# Patient Record
Sex: Female | Born: 1980 | Race: White | Hispanic: No | Marital: Married | Smoking: Never smoker
Health system: Southern US, Community
[De-identification: ages and names within clinical notes are randomized; demographics above are authoritative.]

## PROBLEM LIST (undated history)

## (undated) DIAGNOSIS — F909 Attention-deficit hyperactivity disorder, unspecified type: Secondary | ICD-10-CM

## (undated) DIAGNOSIS — F419 Anxiety disorder, unspecified: Secondary | ICD-10-CM

## (undated) DIAGNOSIS — G43909 Migraine, unspecified, not intractable, without status migrainosus: Secondary | ICD-10-CM

## (undated) DIAGNOSIS — T7840XA Allergy, unspecified, initial encounter: Secondary | ICD-10-CM

## (undated) DIAGNOSIS — K219 Gastro-esophageal reflux disease without esophagitis: Secondary | ICD-10-CM

## (undated) HISTORY — DX: Anxiety disorder, unspecified: F41.9

## (undated) HISTORY — DX: Gastro-esophageal reflux disease without esophagitis: K21.9

## (undated) HISTORY — DX: Migraine, unspecified, not intractable, without status migrainosus: G43.909

## (undated) HISTORY — DX: Allergy, unspecified, initial encounter: T78.40XA

---
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
## 2004-05-16 ENCOUNTER — Ambulatory Visit: Payer: Self-pay | Admitting: Family Medicine

## 2004-10-20 ENCOUNTER — Ambulatory Visit: Payer: Self-pay | Admitting: Family Medicine

## 2005-02-13 ENCOUNTER — Ambulatory Visit: Payer: Self-pay | Admitting: Family Medicine

## 2005-02-16 ENCOUNTER — Ambulatory Visit: Payer: Self-pay | Admitting: Family Medicine

## 2005-02-21 ENCOUNTER — Other Ambulatory Visit: Admission: RE | Admit: 2005-02-21 | Discharge: 2005-02-21 | Payer: Self-pay | Admitting: Obstetrics and Gynecology

## 2005-03-02 ENCOUNTER — Ambulatory Visit: Payer: Self-pay | Admitting: Family Medicine

## 2005-06-21 ENCOUNTER — Ambulatory Visit: Payer: Self-pay | Admitting: Family Medicine

## 2005-07-17 ENCOUNTER — Ambulatory Visit: Payer: Self-pay | Admitting: Family Medicine

## 2005-10-01 ENCOUNTER — Ambulatory Visit: Payer: Self-pay | Admitting: Family Medicine

## 2006-03-05 ENCOUNTER — Ambulatory Visit: Payer: Self-pay | Admitting: Family Medicine

## 2006-05-08 ENCOUNTER — Ambulatory Visit: Payer: Self-pay | Admitting: Family Medicine

## 2006-11-21 ENCOUNTER — Ambulatory Visit: Payer: Self-pay | Admitting: Family Medicine

## 2006-11-21 DIAGNOSIS — E669 Obesity, unspecified: Secondary | ICD-10-CM | POA: Insufficient documentation

## 2006-11-21 DIAGNOSIS — R635 Abnormal weight gain: Secondary | ICD-10-CM

## 2006-11-21 DIAGNOSIS — R5383 Other fatigue: Secondary | ICD-10-CM

## 2006-11-21 DIAGNOSIS — R5381 Other malaise: Secondary | ICD-10-CM | POA: Insufficient documentation

## 2006-11-21 DIAGNOSIS — E663 Overweight: Secondary | ICD-10-CM | POA: Insufficient documentation

## 2006-11-22 LAB — CONVERTED CEMR LAB
CO2: 25 meq/L (ref 19–32)
Calcium: 9.2 mg/dL (ref 8.4–10.5)
Chloride: 106 meq/L (ref 96–112)
Eosinophils Relative: 2.7 % (ref 0.0–5.0)
Glucose, Bld: 80 mg/dL (ref 70–99)
HCT: 35.5 % — ABNORMAL LOW (ref 36.0–46.0)
Hemoglobin: 12.5 g/dL (ref 12.0–15.0)
MCHC: 35.1 g/dL (ref 30.0–36.0)
MCV: 90.3 fL (ref 78.0–100.0)
Monocytes Absolute: 0.4 10*3/uL (ref 0.2–0.7)
Neutrophils Relative %: 55.3 % (ref 43.0–77.0)
RBC: 3.93 M/uL (ref 3.87–5.11)
RDW: 11.9 % (ref 11.5–14.6)
Sodium: 138 meq/L (ref 135–145)
WBC: 6.2 10*3/uL (ref 4.5–10.5)

## 2006-12-09 ENCOUNTER — Telehealth (INDEPENDENT_AMBULATORY_CARE_PROVIDER_SITE_OTHER): Payer: Self-pay | Admitting: *Deleted

## 2007-01-01 ENCOUNTER — Ambulatory Visit: Payer: Self-pay | Admitting: Family Medicine

## 2007-04-28 ENCOUNTER — Telehealth: Payer: Self-pay | Admitting: Family Medicine

## 2007-05-28 ENCOUNTER — Ambulatory Visit: Payer: Self-pay | Admitting: Family Medicine

## 2007-09-24 ENCOUNTER — Ambulatory Visit: Payer: Self-pay | Admitting: Internal Medicine

## 2007-10-09 ENCOUNTER — Ambulatory Visit: Payer: Self-pay | Admitting: Family Medicine

## 2007-10-09 ENCOUNTER — Telehealth (INDEPENDENT_AMBULATORY_CARE_PROVIDER_SITE_OTHER): Payer: Self-pay | Admitting: Internal Medicine

## 2007-10-21 ENCOUNTER — Ambulatory Visit: Payer: Self-pay | Admitting: Family Medicine

## 2007-12-04 ENCOUNTER — Ambulatory Visit: Payer: Self-pay | Admitting: Family Medicine

## 2008-01-21 ENCOUNTER — Ambulatory Visit: Payer: Self-pay | Admitting: Family Medicine

## 2008-02-02 ENCOUNTER — Ambulatory Visit: Payer: Self-pay | Admitting: Family Medicine

## 2008-02-11 ENCOUNTER — Encounter: Payer: Self-pay | Admitting: Family Medicine

## 2008-05-31 ENCOUNTER — Ambulatory Visit: Payer: Self-pay | Admitting: Family Medicine

## 2008-06-02 ENCOUNTER — Telehealth (INDEPENDENT_AMBULATORY_CARE_PROVIDER_SITE_OTHER): Payer: Self-pay | Admitting: *Deleted

## 2008-07-05 ENCOUNTER — Telehealth: Payer: Self-pay | Admitting: Family Medicine

## 2009-03-25 ENCOUNTER — Telehealth: Payer: Self-pay | Admitting: Family Medicine

## 2009-03-25 ENCOUNTER — Ambulatory Visit: Payer: Self-pay | Admitting: Family Medicine

## 2009-03-25 LAB — CONVERTED CEMR LAB
Bilirubin Urine: NEGATIVE
Glucose, Urine, Semiquant: NEGATIVE
Ketones, urine, test strip: NEGATIVE
Protein, U semiquant: 30
Urobilinogen, UA: 0.2
pH: 6.5

## 2009-09-01 ENCOUNTER — Telehealth: Payer: Self-pay | Admitting: Family Medicine

## 2010-03-08 ENCOUNTER — Ambulatory Visit: Payer: Self-pay | Admitting: Family Medicine

## 2010-03-08 DIAGNOSIS — G43909 Migraine, unspecified, not intractable, without status migrainosus: Secondary | ICD-10-CM

## 2010-04-27 ENCOUNTER — Encounter: Payer: Self-pay | Admitting: Family Medicine

## 2010-05-02 NOTE — Assessment & Plan Note (Signed)
Summary: CONGESTION/DIAHHREA/DLO   Vital Signs:  Patient profile:   30 year old female Height:      66 inches Weight:      153 pounds BMI:     24.78 Temp:     98.4 degrees F oral Pulse rate:   84 / minute Pulse rhythm:   regular BP sitting:   116 / 78  (left arm) Cuff size:   regular  Vitals Entered By: Sydell Axon LPN (March 08, 2010 11:11 AM) CC: Dry cough, sore throat, upset stomach with nausea and diarrhea, could not see out of right eye when she first got up this morning, vision is fine now, has a history of migraines  Vision Screening:Left eye w/o correction: 20 / 20 Right Eye w/o correction: 20 / 20 Both eyes w/o correction:  20/ 20         History of Present Illness: Pt had episode this AM of no vision in her right eye upon awakening which resolved in a minute as usual...has migraines but never told this is part of the aura. She has not had a headache today. No other neurologic difficulties. She also is here for congestion...since yesterday PM. She denies headache, no fever or chills but woke up with ST which continues. No ear pain...has some ringing associated with her migraines. She has significant nasal congestion, which requires manual removal which is painful, the rhinitis is occas red, occas green....for a few weeks. She has minimal cough today which is nonproductive. Has had N since Sun AM with eating and V this AM. She has had diarrhea since Sun, once today. She had eaten at [employer]....tailgate pack. She has taken Pepto which has slowed down the diarrhea.  Problems Prior to Update: 1)  Uti  (ICD-599.0) 2)  Viral Infection, Acute  (ICD-079.99) 3)  Viral Uri  (ICD-465.9) 4)  Fatigue  (ICD-780.79) 5)  Weight Gain, Abnormal  (ICD-783.1)  Medications Prior to Update: 1)  Ocella 3-0.03 Mg Tabs (Drospirenone-Ethinyl Estradiol) .... Take 1 Tablet By Mouth Once A Day 2)  Zyrtec Allergy 10 Mg Tabs (Cetirizine Hcl) .... One By Mouth Daily As Needed 3)   One-A-Day Womens  Tabs (Multiple Vitamins-Calcium) .... One By Mouth Daily 4)  Tropamate 150 Mg. .... Take 1 Tablet By Mouth Once A Day 5)  Skelaxin 800 Mg Tabs (Metaxalone) .... As Needed 6)  Baclofen 10 Mg Tabs (Baclofen) .... As Needed 7)  Cipro 250 Mg Tabs (Ciprofloxacin Hcl) .Marland Kitchen.. 1 By Mouth Two Times A Day For 5 Days  Current Medications (verified): 1)  Ocella 3-0.03 Mg Tabs (Drospirenone-Ethinyl Estradiol) .... Take 1 Tablet By Mouth Once A Day 2)  Zyrtec Allergy 10 Mg Tabs (Cetirizine Hcl) .... One By Mouth Daily As Needed 3)  Skelaxin 800 Mg Tabs (Metaxalone) .... As Needed  Allergies: No Known Drug Allergies  Physical Exam  General:  Well-developed,well-nourished,in no acute distress; alert,appropriate and cooperative throughout examination, congested and hoarse. Head:  Normocephalic, atraumatic, and no abnormalities observed.  Sinuses NT. Eyes:  Conjunctiva minimally inflamed in palpebral distribution. Ears:  External ear exam shows no significant lesions or deformities.  Otoscopic examination reveals clear canals, tympanic membranes are intact bilaterally without bulging, retraction, inflammation or discharge. Hearing is grossly normal bilaterally. Nose:  L nare mildly inflamed with clear thick mucous.  R nare occluded with swelling and thick clear mucous, evidence of scant old blood. Mouth:  pharynx pink and moist.  Mild thick white PND. Neck:  No deformities, masses,  or tenderness noted. Lungs:  Normal respiratory effort, chest expands symmetrically. Lungs are clear to auscultation, no crackles or wheezes. Heart:  Normal rate and regular rhythm. S1 and S2 normal without gallop, murmur, click, rub or other extra sounds.   Impression & Recommendations:  Problem # 1:  GASTROENTERITIS (ICD-558.9) Assessment New  New Acute AGE.  Discussed clears, BRAT and slow advancement. May use Immodium or continued Pepto per label.  Discussed use of medication and role of diet.  Encouraged clear liquids and electrolyte replacement fluids. Instructed to call if any signs of worsening dehydration.   Problem # 2:  OTHER DISEASES OF NASAL CAVITY (ICD-478.19) Assessment: New Discussed clearing of nasal cavity and chronic nose congestion trmts.  Problem # 3:  MIGRAINE, CHRONIC (ICD-346.90) Assessment: Unchanged Will have pt seen by Dr Vela Prose. Orders: Neurology Referral (Neuro)  Problem # 4:  VIRAL INFECTION, ACUTE (ICD-079.99) Assessment: Deteriorated Recurrent. See instructions. Discussed symptomatic relief.   Complete Medication List: 1)  Ocella 3-0.03 Mg Tabs (Drospirenone-ethinyl estradiol) .... Take 1 tablet by mouth once a day 2)  Zyrtec Allergy 10 Mg Tabs (Cetirizine hcl) .... One by mouth daily as needed 3)  Skelaxin 800 Mg Tabs (Metaxalone) .... As needed  Other Orders: Vision Screening (60454)  Patient Instructions: 1)  Refer to Dr Vela Prose. 2)  Take Guaifenesin by going to [facility] or [facility] and getting MUCOUS RELIEF EXPECTORANT (400mg ), take 11/2 tabs by mouth AM and NOON. 3)  Drink lots of fluids anytime taking Guaifenesin.  4)  Tyl ES 2 tabs by mouth three times a day.   Orders Added: 1)  Vision Screening [09811] 2)  Neurology Referral [Neuro] 3)  Est. Patient Level IV [91478]    Current Allergies (reviewed today): No known allergies

## 2010-05-02 NOTE — Progress Notes (Signed)
Summary: sinus symptoms  Phone Note Call from Patient   Caller: Patient Summary of Call: Pt called today complaining of sinus infection sxs- green drainage, sinus pain.  Offered appt for tomorrow, no appts available today, but she said she couldnt come tomorrow, also suggested saturday clinic.  She said she would call back if she decides to go to saturday clinic.  Asks what she can do to to treat her sxs.  Advised plenty of fluids, tylenol or advil for pain, antihistamine might help with drainage.  Again, told her to call back if she changes her mind about appt tomorrow. Initial call taken by: Lowella Petties CMA,  September 01, 2009 3:40 PM  Follow-up for Phone Call        Pt called back, says she needs to be seen today. All the physician were booked, and not able to see anymore pts.  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 Pt says she will handle.Daine Gip  September 01, 2009 3:55 PM   Follow-up by: Daine Gip,  September 01, 2009 3:55 PM  Additional Follow-up for Phone Call Additional follow up Details #1::        in that case I recommend she go to urgent care after hours  Additional Follow-up by: Judith Part MD,  September 01, 2009 4:33 PM

## 2010-05-04 ENCOUNTER — Other Ambulatory Visit: Payer: Self-pay | Admitting: Neurology

## 2010-05-05 ENCOUNTER — Other Ambulatory Visit: Payer: Self-pay | Admitting: Neurology

## 2010-05-10 ENCOUNTER — Other Ambulatory Visit: Payer: Self-pay | Admitting: Neurology

## 2010-05-10 DIAGNOSIS — H547 Unspecified visual loss: Secondary | ICD-10-CM

## 2010-05-10 DIAGNOSIS — R519 Headache, unspecified: Secondary | ICD-10-CM

## 2010-05-13 ENCOUNTER — Ambulatory Visit
Admission: RE | Admit: 2010-05-13 | Discharge: 2010-05-13 | Disposition: A | Discharge status: Home / Self Care | Payer: BC Managed Care – PPO | Source: Ambulatory Visit | Attending: Neurology | Admitting: Neurology

## 2010-05-13 DIAGNOSIS — H547 Unspecified visual loss: Secondary | ICD-10-CM

## 2010-05-13 MED ORDER — GADOBENATE DIMEGLUMINE 529 MG/ML IV SOLN
15.0000 mL | Freq: Once | INTRAVENOUS | Status: AC | PRN
  Administered 2010-05-13: 15 mL via INTRAVENOUS

## 2010-05-30 NOTE — Letter (Signed)
Summary: [facility]  [facility]   Imported By: Kassie Mends 05/15/2010 08:43:12  _____________________________________________________________________  External Attachment:    Type:   Image     Comment:   External Document

## 2010-08-29 ENCOUNTER — Encounter: Payer: Self-pay | Admitting: Family Medicine

## 2010-08-29 ENCOUNTER — Telehealth: Payer: Self-pay | Admitting: *Deleted

## 2010-08-29 ENCOUNTER — Ambulatory Visit (INDEPENDENT_AMBULATORY_CARE_PROVIDER_SITE_OTHER): Payer: BC Managed Care – PPO | Admitting: Family Medicine

## 2010-08-29 VITALS — BP 106/70 | HR 88 | Temp 98.2°F | Ht 66.0 in | Wt 164.8 lb

## 2010-08-29 DIAGNOSIS — N390 Urinary tract infection, site not specified: Secondary | ICD-10-CM | POA: Insufficient documentation

## 2010-08-29 DIAGNOSIS — M549 Dorsalgia, unspecified: Secondary | ICD-10-CM

## 2010-08-29 LAB — POCT URINALYSIS DIPSTICK: Spec Grav, UA: 1.01

## 2010-08-29 MED ORDER — CIPROFLOXACIN HCL 250 MG PO TABS: 250.0000 mg | ORAL_TABLET | Freq: Two times a day (BID) | ORAL | Status: AC

## 2010-08-29 NOTE — Assessment & Plan Note (Signed)
Uncomplicated with frequency and urgency ua obsc by azo, spin / micro pos for rbc and wbc and bacteria  Px cipro 250 bid for 5 d Fluids / watch for fever/ worse symptoms Update if not imp in several days Azo ok for symptoms 1-2d

## 2010-08-29 NOTE — Telephone Encounter (Signed)
Patient says that she is having uti symptoms. She has urgency, but when she goes to the bathroom on has a Karis trickle. She is asking to be seen today, but we have not available appt. Can she be added on. Please advise.

## 2010-08-29 NOTE — Progress Notes (Signed)
  Subjective:    Patient ID: Jacqueline Turner, female    DOB: November 23, 1980, 30 y.o.   MRN: [id number]  HPI Started this am- frequency and urgency  No blood in urine  No burning  Some low back pain right in the middle Is dull/ not too bad  No flank pain  No n/v  No fever  Is drinking water and cranberry juice   No chance pregnant at all  Took azo- so urine is orange/discolored   No past medical history on file.  History   Social History  . Marital Status: Single    Spouse Name: N/A    Number of Children: N/A  . Years of Education: N/A   Occupational History  . Not on file.   Social History Main Topics  . Smoking status: Never Smoker   . Smokeless tobacco: Not on file  . Alcohol Use: Not on file  . Drug Use: Not on file  . Sexually Active: Not on file   Other Topics Concern  . Not on file   Social History Narrative  . No narrative on file       Review of Systems Review of Systems  Constitutional: Negative for fever, appetite change, fatigue and unexpected weight change.  Eyes: Negative for pain and visual disturbance.  Respiratory: Negative for cough and shortness of breath.   Cardiovascular: Negative.   Gastrointestinal: Negative for nausea, diarrhea and constipation.  Genitourinary: pos for frequency and urgency / neg for flank pain Skin: Negative for pallor.  Neurological: Negative for weakness, light-headedness, numbness and headaches.  Hematological: Negative for adenopathy. Does not bruise/bleed easily.  Psychiatric/Behavioral: Negative for dysphoric mood. The patient is not nervous/anxious.          Objective:   Physical Exam  Constitutional: She appears well-developed and well-nourished. No distress.  HENT:  Head: Normocephalic and atraumatic.  Mouth/Throat: Oropharynx is clear and moist.  Eyes: Conjunctivae and EOM are normal. Pupils are equal, round, and reactive to light.  Neck: Normal range of motion. Neck supple. No thyromegaly present.    Cardiovascular: Normal rate, regular rhythm and normal heart sounds.   Abdominal: Bowel sounds are normal. There is tenderness. There is no rebound.       Mild suprapubic tenderness   Musculoskeletal: She exhibits no tenderness.       No cva tenderness No spinal tenderness  Lymphadenopathy:    She has no cervical adenopathy.  Neurological: She is alert. She has normal reflexes.  Skin: Skin is warm and dry. No rash noted.  Psychiatric: She has a normal mood and affect.          Assessment & Plan:

## 2010-08-29 NOTE — Patient Instructions (Signed)
Drink lots of water  azo is ok for a few more days Take the cipro as directed If worse or increased back pain or nausea/ fever - alert me right away Also alert me if not improved after finishing medicine

## 2010-08-29 NOTE — Telephone Encounter (Signed)
Patient has appt scheduled for today at 4:15 w/ Dr. Milinda Antis.

## 2011-06-13 ENCOUNTER — Other Ambulatory Visit: Payer: Self-pay | Admitting: Neurology

## 2011-06-13 DIAGNOSIS — H539 Unspecified visual disturbance: Secondary | ICD-10-CM

## 2011-06-21 ENCOUNTER — Ambulatory Visit
Admission: RE | Admit: 2011-06-21 | Discharge: 2011-06-21 | Disposition: A | Discharge status: Home / Self Care | Payer: BC Managed Care – PPO | Source: Ambulatory Visit | Attending: Neurology | Admitting: Neurology

## 2011-06-21 DIAGNOSIS — H539 Unspecified visual disturbance: Secondary | ICD-10-CM

## 2013-10-30 ENCOUNTER — Ambulatory Visit: Payer: BC Managed Care – PPO | Admitting: Internal Medicine

## 2014-12-27 ENCOUNTER — Ambulatory Visit: Payer: BC Managed Care – PPO | Admitting: Internal Medicine

## 2019-09-02 DIAGNOSIS — G43909 Migraine, unspecified, not intractable, without status migrainosus: Secondary | ICD-10-CM | POA: Insufficient documentation

## 2019-09-02 DIAGNOSIS — J302 Other seasonal allergic rhinitis: Secondary | ICD-10-CM | POA: Insufficient documentation

## 2020-01-13 ENCOUNTER — Other Ambulatory Visit: Payer: Self-pay

## 2020-01-14 ENCOUNTER — Ambulatory Visit: Payer: BC Managed Care – PPO | Admitting: Family Medicine

## 2020-01-14 ENCOUNTER — Encounter: Payer: Self-pay | Admitting: Family Medicine

## 2020-01-14 VITALS — BP 130/76 | HR 85 | Temp 98.4°F | Ht 66.75 in | Wt 225.2 lb

## 2020-01-14 DIAGNOSIS — Z23 Encounter for immunization: Secondary | ICD-10-CM | POA: Diagnosis not present

## 2020-01-14 DIAGNOSIS — E559 Vitamin D deficiency, unspecified: Secondary | ICD-10-CM | POA: Diagnosis not present

## 2020-01-14 DIAGNOSIS — R600 Localized edema: Secondary | ICD-10-CM | POA: Diagnosis not present

## 2020-01-14 DIAGNOSIS — Z0000 Encounter for general adult medical examination without abnormal findings: Secondary | ICD-10-CM | POA: Diagnosis not present

## 2020-01-14 NOTE — Progress Notes (Signed)
Jacqueline Turner is a 39 y.o. female  Chief Complaint  Patient presents with  . Establish Care    NP-CPE/labs.  not fasting today. wantsflu shot today    HPI: Jacqueline Turner is a 39 y.o. female here to establish care with our office and for annual CPE, labs. She is not fasting today and will RTO for fasting lab appt.  She would like flu vaccine today.  She had covid in 04/2019.  She notes occasional heart "flutter" or palpitations. Thought to be stress-related. B/L LE edema x years, happens intermittently, worse as day goes on and improves overnight. No SOB, DOE, orthopnea.   Last PAP: Dr. Rana Snare   Dentist - UTD Vision - wears glasses and UTD on exam  Med refills needed today? none   Past Medical History:  Diagnosis Date  . Allergy   . Anxiety   . Migraines     History reviewed. No pertinent surgical history.  Social History   Socioeconomic History  . Marital status: Single    Spouse name: Not on file  . Number of children: Not on file  . Years of education: Not on file  . Highest education level: Not on file  Occupational History  . Not on file  Tobacco Use  . Smoking status: Never Smoker  . Smokeless tobacco: Never Used  Vaping Use  . Vaping Use: Never used  Substance and Sexual Activity  . Alcohol use: Yes    Comment: socially  . Drug use: Never  . Sexual activity: Yes  Other Topics Concern  . Not on file  Social History Narrative  . Not on file   Social Determinants of Health   Financial Resource Strain:   . Difficulty of Paying Living Expenses: Not on file  Food Insecurity:   . Worried About Programme researcher, broadcasting/film/video in the Last Year: Not on file  . Ran Out of Food in the Last Year: Not on file  Transportation Needs:   . Lack of Transportation (Medical): Not on file  . Lack of Transportation (Non-Medical): Not on file  Physical Activity:   . Days of Exercise per Week: Not on file  . Minutes of Exercise per Session: Not on file  Stress:   .  Feeling of Stress : Not on file  Social Connections:   . Frequency of Communication with Friends and Family: Not on file  . Frequency of Social Gatherings with Friends and Family: Not on file  . Attends Religious Services: Not on file  . Active Member of Clubs or Organizations: Not on file  . Attends Banker Meetings: Not on file  . Marital Status: Not on file  Intimate Partner Violence:   . Fear of Current or Ex-Partner: Not on file  . Emotionally Abused: Not on file  . Physically Abused: Not on file  . Sexually Abused: Not on file    Family History  Problem Relation Age of Onset  . Cancer Mother        breast cancer  . Heart disease Father   . Hypertension Father   . Hyperlipidemia Father      Immunization History  Administered Date(s) Administered  . Influenza,inj,Quad PF,6+ Mos 01/14/2020  . PFIZER SARS-COV-2 Vaccination 06/05/2019, 06/26/2019  . Td 03/05/2006    Outpatient Encounter Medications as of 01/14/2020  Medication Sig  . BLISOVI 24 FE 1-20 MG-MCG(24) tablet Take 1 tablet by mouth daily.  . cetirizine (ZYRTEC) 10 MG tablet Take by  mouth.  . Cholecalciferol (VITAMIN D) 125 MCG (5000 UT) CAPS   . Multiple Vitamins-Minerals (MULTIVITAMIN ADULT EXTRA C PO) multivitamin  . [DISCONTINUED] cetirizine (ZYRTEC) 10 MG tablet Take 10 mg by mouth daily as needed.    . [DISCONTINUED] drospirenone-ethinyl estradiol (YASMIN) 3-0.03 MG per tablet Take 1 tablet by mouth daily.   (Patient not taking: Reported on 01/14/2020)  . [DISCONTINUED] etodolac (LODINE) 500 MG tablet Take 500 mg by mouth 2 (two) times daily as needed.   (Patient not taking: Reported on 01/14/2020)  . [DISCONTINUED] imipramine (TOFRANIL) 10 MG tablet Take 30 mg by mouth at bedtime.   (Patient not taking: Reported on 01/14/2020)  . [DISCONTINUED] metaxalone (SKELAXIN) 800 MG tablet Take by mouth as needed.   (Patient not taking: Reported on 01/14/2020)  . [DISCONTINUED] Multiple Vitamins-Minerals  (MULTIVITAMIN ADULT, MINERALS, PO)  (Patient not taking: Reported on 01/14/2020)  . [DISCONTINUED] norgestrel-ethinyl estradiol (CRYSELLE-28) 0.3-30 MG-MCG per tablet Take 1 tablet by mouth daily.   (Patient not taking: Reported on 01/14/2020)  . [DISCONTINUED] Phenazopyridine HCl (AZO STANDARD MAXIMUM STRENGTH) 97.5 MG TABS Take 2 tablets by mouth 3 (three) times daily after meals.   (Patient not taking: Reported on 01/14/2020)  . [DISCONTINUED] propranolol (INDERAL) 10 MG tablet 1 tab twice daily. If not better in 1 wk take 2 tabs twice daily. If no better in a week take 3 tabs twice daily. Pt is presently taking 3 tabs twice a day.  (Patient not taking: Reported on 01/14/2020)   No facility-administered encounter medications on file as of 01/14/2020.     ROS: Gen: no fever, chills  Skin: no rash, itching ENT: no ear pain, ear drainage, nasal congestion, rhinorrhea, sinus pressure, sore throat Eyes: no blurry vision, double vision Resp: no cough, wheeze,SOB CV: no CP, palpitations, + LE edema (see HPI) GI: no heartburn, n/v/d/c, abd pain GU: no dysuria, urgency, frequency, hematuria MSK: no joint pain, myalgias, back pain Neuro: no dizziness, headache, weakness, vertigo Psych: no depression, anxiety, insomnia   Allergies  Allergen Reactions  . Sulfa Antibiotics     Other reaction(s): Unknown    BP 130/76   Pulse 85   Temp 98.4 F (36.9 C) (Temporal)   Ht 5' 6.75" (1.695 m)   Wt 225 lb 3.2 oz (102.2 kg)   SpO2 98%   BMI 35.54 kg/m   Physical Exam Constitutional:      General: She is not in acute distress.    Appearance: She is well-developed.  HENT:     Head: Normocephalic and atraumatic.     Right Ear: Tympanic membrane and ear canal normal.     Left Ear: Tympanic membrane and ear canal normal.     Nose: Nose normal.  Eyes:     Conjunctiva/sclera: Conjunctivae normal.     Pupils: Pupils are equal, round, and reactive to light.  Neck:     Thyroid: No thyromegaly.    Cardiovascular:     Rate and Rhythm: Normal rate and regular rhythm.     Heart sounds: Normal heart sounds. No murmur heard.   Pulmonary:     Effort: Pulmonary effort is normal. No respiratory distress.     Breath sounds: Normal breath sounds. No wheezing or rhonchi.  Abdominal:     General: Bowel sounds are normal. There is no distension.     Palpations: Abdomen is soft. There is no mass.     Tenderness: There is no abdominal tenderness.  Musculoskeletal:     Cervical back:  Neck supple.     Right lower leg: Edema present.     Left lower leg: Edema (trace B/L pedal edema) present.  Lymphadenopathy:     Cervical: No cervical adenopathy.  Skin:    General: Skin is warm and dry.  Neurological:     Mental Status: She is alert and oriented to person, place, and time.     Motor: No abnormal muscle tone.     Coordination: Coordination normal.  Psychiatric:        Behavior: Behavior normal.      A/P:  1. Annual physical exam - discussed importance of regular CV exercise, healthy diet, adequate sleep - UTD on PAP - UTD on dental and vision - UTD on immunizations - AST; Future - ALT; Future - Basic metabolic panel; Future - CBC; Future - Lipid panel; Future - next CPE in 1 year  2. Vitamin D deficiency - taking 5000IU Vit D daily - VITAMIN D 25 Hydroxy (Vit-D Deficiency, Fractures); Future  3. Need for immunization against influenza - Flu Vaccine QUAD 6+ mos PF IM (Fluarix Quad PF)  4. Bilateral lower extremity edema - dependent edema intermittent x years - discussed importance of adequate hydration, limiting sodium, elevating legs when able, consider compression stockings - no need for PRN diuretic at this time  This visit occurred during the SARS-CoV-2 public health emergency.  Safety protocols were in place, including screening questions prior to the visit, additional usage of staff PPE, and extensive cleaning of exam room while observing appropriate contact time as  indicated for disinfecting solutions.

## 2020-01-25 ENCOUNTER — Other Ambulatory Visit: Payer: Self-pay

## 2020-01-25 ENCOUNTER — Other Ambulatory Visit (INDEPENDENT_AMBULATORY_CARE_PROVIDER_SITE_OTHER): Payer: BC Managed Care – PPO

## 2020-01-25 DIAGNOSIS — E559 Vitamin D deficiency, unspecified: Secondary | ICD-10-CM

## 2020-01-25 DIAGNOSIS — Z0000 Encounter for general adult medical examination without abnormal findings: Secondary | ICD-10-CM | POA: Diagnosis not present

## 2020-01-25 LAB — LIPID PANEL
Cholesterol: 183 mg/dL (ref 0–200)
HDL: 55.3 mg/dL (ref >39.00)
LDL Cholesterol: 94 mg/dL (ref 0–99)
NonHDL: 127.64
Total CHOL/HDL Ratio: 3
Triglycerides: 167 mg/dL — ABNORMAL HIGH (ref 0.0–149.0)
VLDL: 33.4 mg/dL (ref 0.0–40.0)

## 2020-01-25 LAB — BASIC METABOLIC PANEL
BUN: 10 mg/dL (ref 6–23)
CO2: 24 mEq/L (ref 19–32)
Calcium: 8.9 mg/dL (ref 8.4–10.5)
Chloride: 106 mEq/L (ref 96–112)
Creatinine, Ser: 0.68 mg/dL (ref 0.40–1.20)
GFR: 110.71 mL/min (ref >60.00)
Glucose, Bld: 80 mg/dL (ref 70–99)
Potassium: 4.7 mEq/L (ref 3.5–5.1)
Sodium: 138 mEq/L (ref 135–145)

## 2020-01-25 LAB — AST: AST: 16 U/L (ref 0–37)

## 2020-01-25 LAB — CBC
HCT: 37.7 % (ref 36.0–46.0)
Hemoglobin: 12.6 g/dL (ref 12.0–15.0)
MCHC: 33.5 g/dL (ref 30.0–36.0)
MCV: 93.1 fl (ref 78.0–100.0)
Platelets: 239 10*3/uL (ref 150.0–400.0)
RBC: 4.05 Mil/uL (ref 3.87–5.11)
RDW: 13.2 % (ref 11.5–15.5)
WBC: 5.4 10*3/uL (ref 4.0–10.5)

## 2020-01-25 LAB — ALT: ALT: 18 U/L (ref 0–35)

## 2020-01-25 LAB — VITAMIN D 25 HYDROXY (VIT D DEFICIENCY, FRACTURES): VITD: 46.78 ng/mL (ref 30.00–100.00)

## 2020-02-04 ENCOUNTER — Encounter: Payer: Self-pay | Admitting: Family Medicine

## 2020-06-06 ENCOUNTER — Encounter: Payer: Self-pay | Admitting: Family Medicine

## 2020-06-06 ENCOUNTER — Telehealth (INDEPENDENT_AMBULATORY_CARE_PROVIDER_SITE_OTHER): Payer: BC Managed Care – PPO | Admitting: Family Medicine

## 2020-06-06 VITALS — Temp 99.3°F | Ht 66.75 in | Wt 225.0 lb

## 2020-06-06 DIAGNOSIS — J069 Acute upper respiratory infection, unspecified: Secondary | ICD-10-CM

## 2020-06-06 NOTE — Progress Notes (Signed)
[facility] [address] [place] [address] Dept: [phone] Dept Fax: [phone]  Virtual Video Visit  I connected with Soledad Gerlach Killman on 06/06/20 at  4:00 PM EST by a video enabled telemedicine application and verified that I am speaking with the correct person using two identifiers.  Location patient: Home Location provider: Clinic Persons participating in the virtual visit: Patient, Provider  I discussed the limitations of evaluation and management by telemedicine and the availability of in person appointments. The patient expressed understanding and agreed to proceed.  Chief Complaint  Patient presents with  . Acute Visit    Patient c/o having sinus pressure, congestion, runny nose, sneezing, pressure in ears x 2 days.  Took covid test was negative.  Has taken Tylenol and mucinex with Bertholf relief.     SUBJECTIVE:  HPI: Jacqueline Turner is a 40 y.o. female who presents with a 2-day hsitory of sinus pressure, sneezing, nasal congestion, and ear pressure. She denies any cough, fever, dyspnea, N/V/D. Her husband had been sick last week with similar symptoms. Both have had COVID tests which were negative. Ms. Dilworth has been using Dayquil, but was worried it might make her loopy today while working from home. She switched to Mucinex and is using Tylenol. She does have a history of seasonal allergies. She uses either Xyzal or Zyrtec for allergy issues. She does not use tobacco and is not exposed to 2nd hand smoke at home.  Patient Active Problem List   Diagnosis Date Noted  . Migraine 09/02/2019  . Seasonal allergies 09/02/2019  . UTI (lower urinary tract infection) 08/29/2010  . FATIGUE 11/21/2006  . WEIGHT GAIN, ABNORMAL 11/21/2006   No past surgical history on file.  Family History  Problem Relation Age of Onset  . Cancer Mother        breast cancer  . Heart disease Father   . Hypertension Father   .  Hyperlipidemia Father    Social History   Tobacco Use  . Smoking status: Never Smoker  . Smokeless tobacco: Never Used  Vaping Use  . Vaping Use: Never used  Substance Use Topics  . Alcohol use: Yes    Comment: socially  . Drug use: Never    Current Outpatient Medications:  .  BLISOVI 24 FE 1-20 MG-MCG(24) tablet, Take 1 tablet by mouth daily., Disp: , Rfl:  .  cetirizine (ZYRTEC) 10 MG tablet, Take by mouth., Disp: , Rfl:  .  Cholecalciferol (VITAMIN D) 125 MCG (5000 UT) CAPS, , Disp: , Rfl:  .  Multiple Vitamins-Minerals (MULTIVITAMIN ADULT EXTRA C PO), multivitamin, Disp: , Rfl:   Allergies  Allergen Reactions  . Sulfa Antibiotics     Other reaction(s): Unknown   ROS: See pertinent positives and negatives per HPI.  OBSERVATIONS/OBJECTIVE:  VITALS per patient if applicable: Today's Vitals   06/06/20 1555  Temp: 99.3 F (37.4 C)  Weight: 225 lb (102.1 kg)  Height: 5' 6.75" (1.695 m)   Body mass index is 35.5 kg/m.   GENERAL: alert, oriented, appears well and in no acute distress  HEENT: atraumatic, conjunctiva clear, no obvious abnormalities on inspection of external nose and ears. Patients speech consistent with nasal congestion.  NECK: normal movements of the head and neck  LUNGS: on inspection no signs of respiratory distress, breathing rate appears normal, no obvious gross SOB, gasping or wheezing, no conversational dyspnea  CV: no obvious cyanosis  PSYCH/NEURO: pleasant and cooperative, no obvious depression or  anxiety, speech and thought processing grossly intact  ASSESSMENT AND PLAN:  1. Viral upper respiratory illness Ms. Diana appears to have a viral URI. We discussed home care for viral illness. I suggested she might add Sudafed to her Mucinex and Tylenol. She was concerned because of past history of sinus infections. We discussed judicious use of antibiotics. If she develops worsening sinus symptoms and her issue is not resolved by next Wed., she  is to reach out to me to consider antibiotics at that point.   I discussed the assessment and treatment plan with the patient. The patient was provided an opportunity to ask questions and all were answered. The patient agreed with the plan and demonstrated an understanding of the instructions.   The patient was advised to call back or seek an in-person evaluation if the symptoms worsen or if the condition fails to improve as anticipated.   Loyola Mast, MD

## 2020-11-18 ENCOUNTER — Emergency Department: Payer: BC Managed Care – PPO

## 2020-11-18 ENCOUNTER — Other Ambulatory Visit: Payer: Self-pay

## 2020-11-18 DIAGNOSIS — K219 Gastro-esophageal reflux disease without esophagitis: Secondary | ICD-10-CM | POA: Diagnosis not present

## 2020-11-18 DIAGNOSIS — R1010 Upper abdominal pain, unspecified: Secondary | ICD-10-CM | POA: Diagnosis present

## 2020-11-18 LAB — CBC
HCT: 37.6 % (ref 36.0–46.0)
Hemoglobin: 13.2 g/dL (ref 12.0–15.0)
MCH: 31.9 pg (ref 26.0–34.0)
MCHC: 35.1 g/dL (ref 30.0–36.0)
MCV: 90.8 fL (ref 80.0–100.0)
Platelets: 278 K/uL (ref 150–400)
RBC: 4.14 MIL/uL (ref 3.87–5.11)
RDW: 12.7 % (ref 11.5–15.5)
WBC: 8.9 K/uL (ref 4.0–10.5)
nRBC: 0 % (ref 0.0–0.2)

## 2020-11-18 LAB — BASIC METABOLIC PANEL
Anion gap: 7 (ref 5–15)
BUN: 16 mg/dL (ref 6–20)
CO2: 24 mmol/L (ref 22–32)
Calcium: 9 mg/dL (ref 8.9–10.3)
Chloride: 105 mmol/L (ref 98–111)
Creatinine, Ser: 0.79 mg/dL (ref 0.44–1.00)
GFR, Estimated: >60 mL/min (ref >60)
Glucose, Bld: 172 mg/dL — ABNORMAL HIGH (ref 70–99)
Potassium: 4.2 mmol/L (ref 3.5–5.1)
Sodium: 136 mmol/L (ref 135–145)

## 2020-11-18 LAB — LIPASE, BLOOD: Lipase: 38 U/L (ref 11–51)

## 2020-11-18 LAB — HEPATIC FUNCTION PANEL
ALT: 34 U/L (ref 0–44)
AST: 60 U/L — ABNORMAL HIGH (ref 15–41)
Albumin: 3.9 g/dL (ref 3.5–5.0)
Alkaline Phosphatase: 47 U/L (ref 38–126)
Bilirubin, Direct: 0.2 mg/dL (ref 0.0–0.2)
Indirect Bilirubin: 0.5 mg/dL (ref 0.3–0.9)
Total Bilirubin: 0.7 mg/dL (ref 0.3–1.2)
Total Protein: 7.4 g/dL (ref 6.5–8.1)

## 2020-11-18 LAB — TROPONIN I (HIGH SENSITIVITY): Troponin I (High Sensitivity): <2 ng/L (ref <18)

## 2020-11-18 NOTE — ED Triage Notes (Signed)
Epigastric burning and sweatiness after eating, hx gerd

## 2020-11-19 ENCOUNTER — Emergency Department
Admission: EM | Admit: 2020-11-19 | Discharge: 2020-11-19 | Disposition: A | Discharge status: Home / Self Care | Payer: BC Managed Care – PPO | Attending: Emergency Medicine | Admitting: Emergency Medicine

## 2020-11-19 DIAGNOSIS — K219 Gastro-esophageal reflux disease without esophagitis: Secondary | ICD-10-CM

## 2020-11-19 DIAGNOSIS — R0789 Other chest pain: Secondary | ICD-10-CM

## 2020-11-19 LAB — TROPONIN I (HIGH SENSITIVITY): Troponin I (High Sensitivity): <2 ng/L (ref <18)

## 2020-11-19 MED ORDER — PANTOPRAZOLE SODIUM 40 MG PO TBEC
40.0000 mg | DELAYED_RELEASE_TABLET | Freq: Once | ORAL | Status: AC
  Administered 2020-11-19: 40 mg via ORAL
  Filled 2020-11-19: qty 1

## 2020-11-19 MED ORDER — ALUM & MAG HYDROXIDE-SIMETH 200-200-20 MG/5ML PO SUSP
30.0000 mL | Freq: Once | ORAL | Status: AC
  Administered 2020-11-19: 30 mL via ORAL
  Filled 2020-11-19: qty 30

## 2020-11-19 MED ORDER — PANTOPRAZOLE SODIUM 40 MG PO TBEC: 40.0000 mg | DELAYED_RELEASE_TABLET | Freq: Every day | ORAL | 1 refills | Status: DC

## 2020-11-19 MED ORDER — LIDOCAINE VISCOUS HCL 2 % MT SOLN
15.0000 mL | Freq: Once | OROMUCOSAL | Status: AC
  Administered 2020-11-19: 15 mL via ORAL
  Filled 2020-11-19: qty 15

## 2020-11-19 MED ORDER — ONDANSETRON 4 MG PO TBDP
4.0000 mg | ORAL_TABLET | Freq: Once | ORAL | Status: AC
  Administered 2020-11-19: 4 mg via ORAL
  Filled 2020-11-19: qty 1

## 2020-11-19 NOTE — Discharge Instructions (Signed)
Please avoid NSAIDs such as aspirin (Goody powders), ibuprofen (Motrin, Advil), naproxen (Aleve) as these may worsen your symptoms.  Tylenol 1000 mg every 6 hours is safe to take as long as you have no history of liver problems (heavy alcohol use, cirrhosis, hepatitis).  Please avoid spicy, acidic (citrus fruits, tomato based sauces, salsa), greasy, fatty foods.  Please avoid caffeine and alcohol.  Smoking can also make GERD/acid reflux worse.  Over the counter medications such as TUMS, Maalox or Mylanta, pepcid, Prilosec or Nexium may help with your symptoms.  Do not take Prilosec or Nexium if you are already prescribed a proton pump inhibitor.  

## 2020-11-19 NOTE — ED Notes (Signed)
Pt NAD, a/ox4. Pt verbalizes understanding of all DC and f/u instructions. All questions answered. Pt walks with steady gait to lobby 

## 2020-11-19 NOTE — ED Provider Notes (Signed)
[facility] Emergency Department Provider Note  ____________________________________________   Event Date/Time   First MD Initiated Contact with Patient 11/19/20 0013     (approximate)  I have reviewed the triage vital signs and the nursing notes.   HISTORY  Chief Complaint Chest Pain    HPI Jacqueline Turner is a 40 y.o. female with history of acid reflux, migraines, anxiety, allergies who presents to the emergency department with complaints of upper abdominal pain underneath her sternum that she describes as sharp.  She had diaphoresis with this episode and nausea.  No chest pain or shortness of breath.  States she feels much better now.  She did not take any medications prior to arrival.  She became concerned because her father had an MI in his 61s.  She denies any history of hypertension, diabetes, hyperlipidemia, tobacco use.  She is on birth control but denies history of PE, DVT.  No recent fractures, surgery, trauma, hospitalization, travel, immobilization.  No calf tenderness or calf swelling.       Past Medical History:  Diagnosis Date   Allergy    Anxiety    Migraines     Patient Active Problem List   Diagnosis Date Noted   Migraine 09/02/2019   Seasonal allergies 09/02/2019   UTI (lower urinary tract infection) 08/29/2010   FATIGUE 11/21/2006   WEIGHT GAIN, ABNORMAL 11/21/2006    No past surgical history on file.  Prior to Admission medications   Medication Sig Start Date End Date Taking? Authorizing Provider  liraglutide (VICTOZA) 18 MG/3ML SOPN Inject into the skin.   Yes [provider]  Lisdexamfetamine Dimesylate (VYVANSE PO) Take by mouth.   Yes [provider]  pantoprazole (PROTONIX) 40 MG tablet Take 1 tablet (40 mg total) by mouth daily. 11/19/20 11/19/21 Yes Brixton Schnapp N, DO  BLISOVI 24 FE 1-20 MG-MCG(24) tablet Take 1 tablet by mouth daily. 12/26/19   [provider]  cetirizine (ZYRTEC) 10 MG  tablet Take by mouth.    [provider]  Cholecalciferol (VITAMIN D) 125 MCG (5000 UT) CAPS     [provider]  Multiple Vitamins-Minerals (MULTIVITAMIN ADULT EXTRA C PO) multivitamin    [provider]    Allergies Sulfa antibiotics  Family History  Problem Relation Age of Onset   Cancer Mother        breast cancer   Heart disease Father    Hypertension Father    Hyperlipidemia Father     Social History Social History   Tobacco Use   Smoking status: Never   Smokeless tobacco: Never  Vaping Use   Vaping Use: Never used  Substance Use Topics   Alcohol use: Yes    Comment: socially   Drug use: Never    Review of Systems Constitutional: No fever. Eyes: No visual changes. ENT: No sore throat. Cardiovascular: Denies chest pain. Respiratory: Denies shortness of breath. Gastrointestinal: No vomiting, diarrhea. Genitourinary: Negative for dysuria. Musculoskeletal: Negative for back pain. Skin: Negative for rash. Neurological: Negative for focal weakness or numbness.  ____________________________________________   PHYSICAL EXAM:  VITAL SIGNS: ED Triage Vitals  Enc Vitals Group     BP 11/18/20 2101 138/89     Pulse Rate 11/18/20 2101 89     Resp 11/18/20 2101 16     Temp 11/18/20 2101 97.7 F (36.5 C)     Temp Source 11/18/20 2101 Oral     SpO2 11/18/20 2101 99 %     Weight  11/18/20 2056 205 lb (93 kg)     Height 11/18/20 2056 5\' 6"  (1.676 m)     Head Circumference --      Peak Flow --      Pain Score 11/18/20 2056 7     Pain Loc --      Pain Edu? --      Excl. in GC? --    CONSTITUTIONAL: Alert and oriented and responds appropriately to questions. Well-appearing; well-nourished HEAD: Normocephalic EYES: Conjunctivae clear, pupils appear equal, EOM appear intact ENT: normal nose; moist mucous membranes NECK: Supple, normal ROM CARD: RRR; S1 and S2 appreciated; no murmurs, no clicks, no rubs, no gallops RESP: Normal chest  excursion without splinting or tachypnea; breath sounds clear and equal bilaterally; no wheezes, no rhonchi, no rales, no hypoxia or respiratory distress, speaking full sentences ABD/GI: Normal bowel sounds; non-distended; soft, non-tender, no rebound, no guarding, no peritoneal signs, no hepatosplenomegaly, negative Murphy sign, no tenderness in the right upper quadrant, no tenderness at McBurney's point BACK: The back appears normal EXT: Normal ROM in all joints; no deformity noted, no edema; no cyanosis SKIN: Normal color for age and race; warm; no rash on exposed skin NEURO: Moves all extremities equally PSYCH: The patient's mood and manner are appropriate.  ____________________________________________   LABS (all labs ordered are listed, but only abnormal results are displayed)  Labs Reviewed  BASIC METABOLIC PANEL - Abnormal; Notable for the following components:      Result Value   Glucose, Bld 172 (*)    All other components within normal limits  HEPATIC FUNCTION PANEL - Abnormal; Notable for the following components:   AST 60 (*)    All other components within normal limits  CBC  LIPASE, BLOOD  TROPONIN I (HIGH SENSITIVITY)  TROPONIN I (HIGH SENSITIVITY)   ____________________________________________  EKG   EKG Interpretation  Date/Time:  Friday November 18 2020 20:58:41 EDT Ventricular Rate:  88 PR Interval:  134 QRS Duration: 90 QT Interval:  382 QTC Calculation: 462 R Axis:   87 Text Interpretation: Normal sinus rhythm with sinus arrhythmia Normal ECG Confirmed by 01-16-1969 [phone]) on 11/19/2020 8:02:59 AM        ____________________________________________  RADIOLOGY 11/21/2020 Yuko Coventry, personally viewed and evaluated these images (plain radiographs) as part of my medical decision making, as well as reviewing the written report by the radiologist.  ED MD interpretation: Chest x-ray clear  Official radiology report(s): DG Chest 2 View  Result Date:  11/18/2020 CLINICAL DATA:  Chest pain, epigastric abdominal pain, diaphoresis EXAM: CHEST - 2 VIEW COMPARISON:  None. FINDINGS: The heart size and mediastinal contours are within normal limits. Both lungs are clear. The visualized skeletal structures are unremarkable. IMPRESSION: No active cardiopulmonary disease. Electronically Signed   By: 11/20/2020 M.D.   On: 11/18/2020 21:18    ____________________________________________   PROCEDURES  Procedure(s) performed (including Critical Care):  Procedures   ____________________________________________   INITIAL IMPRESSION / ASSESSMENT AND PLAN / ED COURSE  As part of my medical decision making, I reviewed the following data within the electronic MEDICAL RECORD NUMBER Nursing notes reviewed and incorporated, Labs reviewed , EKG interpreted , Old chart reviewed, Radiograph reviewed , and Notes from prior ED visits         Patient here with epigastric pain, nausea and diaphoresis that started after eating a prime rib sandwich.  Symptoms have improved.  She now thinks that this was acid reflux that she has a history of  the same.  Cardiac work-up has been reassuring.  Normal EKG, troponin.  Clear chest x-ray.  Doubt ACS, PE, dissection.  Abdominal exam right now is benign.  She has no tenderness at McBurney's point and has a negative Murphy sign.  She has no known history of cholelithiasis.  Low suspicion currently for cholecystitis, pancreatitis.  LFTs unremarkable other than very minimal elevation of her AST.  Lipase is normal.  Patient given GI cocktail and Protonix and she reports feeling much better.  Will discharge home with prescription of Protonix.  Discussed supportive care instructions and return precautions.  She is comfortable with this plan.   At this time, I do not feel there is any life-threatening condition present. I have reviewed, interpreted and discussed all results (EKG, imaging, lab, urine as appropriate) and exam findings with  patient/family. I have reviewed nursing notes and appropriate previous records.  I feel the patient is safe to be discharged home without further emergent workup and can continue workup as an outpatient as needed. Discussed usual and customary return precautions. Patient/family verbalize understanding and are comfortable with this plan.  Outpatient follow-up has been provided as needed. All questions have been answered.  ____________________________________________   FINAL CLINICAL IMPRESSION(S) / ED DIAGNOSES  Final diagnoses:  Atypical chest pain  Gastroesophageal reflux disease, unspecified whether esophagitis present     ED Discharge Orders          Ordered    pantoprazole (PROTONIX) 40 MG tablet  Daily        11/19/20 0137            *Please note:  Jacqueline Turner was evaluated in Emergency Department on 11/19/2020 for the symptoms described in the history of present illness. She was evaluated in the context of the global COVID-19 pandemic, which necessitated consideration that the patient might be at risk for infection with the SARS-CoV-2 virus that causes COVID-19. Institutional protocols and algorithms that pertain to the evaluation of patients at risk for COVID-19 are in a state of rapid change based on information released by regulatory bodies including the CDC and federal and state organizations. These policies and algorithms were followed during the patient's care in the ED.  Some ED evaluations and interventions may be delayed as a result of limited staffing during and the pandemic.*   Note:  This document was prepared using Dragon voice recognition software and may include unintentional dictation errors.    Garnetta Fedrick, Layla Maw, DO 11/19/20 [phone]

## 2020-11-29 ENCOUNTER — Other Ambulatory Visit: Payer: Self-pay

## 2020-11-29 ENCOUNTER — Ambulatory Visit: Payer: BC Managed Care – PPO | Admitting: Family Medicine

## 2020-11-29 ENCOUNTER — Encounter: Payer: Self-pay | Admitting: Family Medicine

## 2020-11-29 VITALS — BP 130/90 | HR 77 | Temp 97.7°F | Ht 66.0 in | Wt 206.8 lb

## 2020-11-29 DIAGNOSIS — K219 Gastro-esophageal reflux disease without esophagitis: Secondary | ICD-10-CM | POA: Diagnosis not present

## 2020-11-29 DIAGNOSIS — R739 Hyperglycemia, unspecified: Secondary | ICD-10-CM | POA: Diagnosis not present

## 2020-11-29 NOTE — Patient Instructions (Signed)
[employer]'s Current Therapy 2021 (pp. 213-216). [place], [place]: Elsevier.">  Gastroesophageal Reflux Disease, Adult Gastroesophageal reflux (GER) happens when acid from the stomach flows up into the tube that connects the mouth and the stomach (esophagus). Normally, food travels down the esophagus and stays in the stomach to be digested. However, when a person has GER, food and stomach acid sometimes move back up into the esophagus. If this becomes a more serious problem, the person may be diagnosed with a disease called gastroesophageal reflux disease (GERD). GERD occurs when the reflux: Happens often. Causes frequent or severe symptoms. Causes problems such as damage to the esophagus. When stomach acid comes in contact with the esophagus, the acid may cause inflammation in the esophagus. Over time, GERD may create small holes (ulcers) in the lining of the esophagus. What are the causes? This condition is caused by a problem with the muscle between the esophagus and the stomach (lower esophageal sphincter, or LES). Normally, the LES muscle closes after food passes through the esophagus to the stomach. When the LES is weakened or abnormal, it does not close properly, and that allows food and stomach acid to go back up into theesophagus. The LES can be weakened by certain dietary substances, medicines, and medical conditions, including: Tobacco use. Pregnancy. Having a hiatal hernia. Alcohol use. Certain foods and beverages, such as coffee, chocolate, onions, and peppermint. What increases the risk? You are more likely to develop this condition if you: Have an increased body weight. Have a connective tissue disorder. Take NSAIDs, such as ibuprofen. What are the signs or symptoms? Symptoms of this condition include: Heartburn. Difficult or painful swallowing and the feeling of having a lump in the throat. A bitter taste in the mouth. Bad breath and having a large amount of saliva. Having an  upset or bloated stomach and belching. Chest pain. Different conditions can cause chest pain. Make sure you see your health care provider if you experience chest pain. Shortness of breath or wheezing. Ongoing (chronic) cough or a nighttime cough. Wearing away of tooth enamel. Weight loss. How is this diagnosed? This condition may be diagnosed based on a medical history and a physical exam. To determine if you have mild or severe GERD, your health care provider may also monitor how you respond to treatment. You may also have tests, including: A test to examine your stomach and esophagus with a small camera (endoscopy). A test that measures the acidity level in your esophagus. A test that measures how much pressure is on your esophagus. A barium swallow or modified barium swallow test to show the shape, size, and functioning of your esophagus. How is this treated? Treatment for this condition may vary depending on how severe your symptoms are. Your health care provider may recommend: Changes to your diet. Medicine. Surgery. The goal of treatment is to help relieve your symptoms and to preventcomplications. Follow these instructions at home: Eating and drinking  Follow a diet as recommended by your health care provider. This may involve avoiding foods and drinks such as: Coffee and tea, with or without caffeine. Drinks that contain alcohol. Energy drinks and sports drinks. Carbonated drinks or sodas. Chocolate and cocoa. Peppermint and mint flavorings. Garlic and onions. Horseradish. Spicy and acidic foods, including peppers, chili powder, curry powder, vinegar, hot sauces, and barbecue sauce. Citrus fruit juices and citrus fruits, such as oranges, lemons, and limes. Tomato-based foods, such as red sauce, chili, salsa, and pizza with red sauce. Fried and fatty foods, such as   donuts, french fries, potato chips, and high-fat dressings. High-fat meats, such as hot dogs and fatty cuts of  red and white meats, such as rib eye steak, sausage, ham, and bacon. High-fat dairy items, such as whole milk, butter, and cream cheese. Eat small, frequent meals instead of large meals. Avoid drinking large amounts of liquid with your meals. Avoid eating meals during the 2-3 hours before bedtime. Avoid lying down right after you eat. Do not exercise right after you eat.  Lifestyle  Do not use any products that contain nicotine or tobacco. These products include cigarettes, chewing tobacco, and vaping devices, such as e-cigarettes. If you need help quitting, ask your health care provider. Try to reduce your stress by using methods such as yoga or meditation. If you need help reducing stress, ask your health care provider. If you are overweight, reduce your weight to an amount that is healthy for you. Ask your health care provider for guidance about a safe weight loss goal.  General instructions Pay attention to any changes in your symptoms. Take over-the-counter and prescription medicines only as told by your health care provider. Do not take aspirin, ibuprofen, or other NSAIDs unless your health care provider told you to take these medicines. Wear loose-fitting clothing. Do not wear anything tight around your waist that causes pressure on your abdomen. Raise (elevate) the head of your bed about 6 inches (15 cm). You can use a wedge to do this. Avoid bending over if this makes your symptoms worse. Keep all follow-up visits. This is important. Contact a health care provider if: You have: New symptoms. Unexplained weight loss. Difficulty swallowing or it hurts to swallow. Wheezing or a persistent cough. A hoarse voice. Your symptoms do not improve with treatment. Get help right away if: You have sudden pain in your arms, neck, jaw, teeth, or back. You suddenly feel sweaty, dizzy, or light-headed. You have chest pain or shortness of breath. You vomit and the vomit is green, yellow, or  black, or it looks like blood or coffee grounds. You faint. You have stool that is red, bloody, or black. You cannot swallow, drink, or eat. These symptoms may represent a serious problem that is an emergency. Do not wait to see if the symptoms will go away. Get medical help right away. Call your local emergency services (911 in the [place] Do not drive yourself to the hospital. Summary Gastroesophageal reflux happens when acid from the stomach flows up into the esophagus. GERD is a disease in which the reflux happens often, causes frequent or severe symptoms, or causes problems such as damage to the esophagus. Treatment for this condition may vary depending on how severe your symptoms are. Your health care provider may recommend diet and lifestyle changes, medicine, or surgery. Contact a health care provider if you have new or worsening symptoms. Take over-the-counter and prescription medicines only as told by your health care provider. Do not take aspirin, ibuprofen, or other NSAIDs unless your health care provider told you to do so. Keep all follow-up visits as told by your health care provider. This is important. This information is not intended to replace advice given to you by your health care provider. Make sure you discuss any questions you have with your healthcare provider. Document Revised: 09/28/2019 Document Reviewed: 09/28/2019 Elsevier Patient Education  2022 Elsevier Inc.  

## 2020-11-29 NOTE — Progress Notes (Signed)
[facility] [address] [place] [address] Dept: [phone] Dept Fax: [phone]  Office Visit  Subjective:    Patient ID: Jacqueline Turner, female    DOB: [birth year], 40 y.o..   MRN: [id number]  Chief Complaint  Patient presents with   Follow-up    2 wk Hosp f/u     History of Present Illness:  Patient is in today for follow-up from a recent ER visit. She was seen at [facility] on 8/20 with a complaint of atypical chest pain. She had an evaluation that did not demonstrate a cardiac cause, and was felt to have GERD and was started on Protonix. Jacqueline Turner notes that she has had progressive issues with acid reflux that have been worsening with time. She used ot have only periodic issues that were managed with Tums. Over time, she would have more significant bouts that required her to take an occasional Prilosec. More recently, these have been happening several times a week. Her most recent episode was also associated with a pressure sensation in the chest and diaphoresis. She finds her reflux is worsened when eating certain foods, such as red sauces, bread, spicy foods, and alcohol. She is now avoiding these.The ER prescribed Protonix and her symptoms have been improved on this therapy.  Past Medical History: Patient Active Problem List   Diagnosis Date Noted   Gastroesophageal reflux 11/29/2020   Migraine 09/02/2019   Seasonal allergies 09/02/2019   UTI (lower urinary tract infection) 08/29/2010   FATIGUE 11/21/2006   Obesity (BMI 30.0-34.9) 11/21/2006   History reviewed. No pertinent surgical history.  Family History  Problem Relation Age of Onset   Cancer Mother        breast cancer   Heart disease Father    Hypertension Father    Hyperlipidemia Father    Outpatient Medications Prior to Visit  Medication Sig Dispense Refill   cetirizine (ZYRTEC) 10 MG tablet Take by mouth.     Cholecalciferol (VITAMIN D) 125 MCG  (5000 UT) CAPS      liraglutide (VICTOZA) 18 MG/3ML SOPN Inject into the skin.     Lisdexamfetamine Dimesylate (VYVANSE PO) Take by mouth.     Multiple Vitamins-Minerals (MULTIVITAMIN ADULT EXTRA C PO) multivitamin     Norethindrone Acetate-Ethinyl Estrad-FE (HAILEY 24 FE) 1-20 MG-MCG(24) tablet Hailey 24 Fe 1 mg-20 mcg (24)/75 mg (4) tablet  TAKE 1 TABLET BY MOUTH EVERY DAY     pantoprazole (PROTONIX) 40 MG tablet Take 1 tablet (40 mg total) by mouth daily. 30 tablet 1   BLISOVI 24 FE 1-20 MG-MCG(24) tablet Take 1 tablet by mouth daily. (Patient not taking: Reported on 11/29/2020)     No facility-administered medications prior to visit.   Allergies  Allergen Reactions   Sulfa Antibiotics     Other reaction(s): Unknown    Objective:   Today's Vitals   11/29/20 1129  BP: 130/90  Pulse: 77  Temp: 97.7 F (36.5 C)  TempSrc: Temporal  SpO2: 99%  Weight: 206 lb 12.8 oz (93.8 kg)  Height: 5\' 6"  (1.676 m)   Body mass index is 33.38 kg/m.   General: Well developed, well nourished. No acute distress. Psych: Alert and oriented x3. Normal mood and affect.  Health Maintenance Due  Topic Date Due   HIV Screening  Never done   Hepatitis C Screening  Never done   PAP SMEAR-Modifier  Never done   TETANUS/TDAP  03/05/2016   INFLUENZA VACCINE  10/31/2020  Lab Results BMP Latest Ref Rng & Units 11/18/2020 01/25/2020 11/21/2006  Glucose 70 - 99 mg/dL 299(M) 80 80  BUN 6 - 20 mg/dL 16 10 12   Creatinine 0.44 - 1.00 mg/dL 4.26 0.7  Sodium 8.34 - 145 mmol/L 136 138 138  Potassium 3.5 - 5.1 mmol/L 4.2 4.7 4.0  Chloride 98 - 111 mmol/L 105 106 106  CO2 22 - 32 mmol/L 24 24 25   Calcium 8.9 - 10.3 mg/dL 9.0 8.9 9.2     Assessment & Plan:   1. Gastroesophageal reflux disease, unspecified whether esophagitis present We discussed the causes of GERD and its management. At this point, I recommend she complete a 1 month course of Protonix. We then will give a trial off of medication. She  should continue to avoid foods that may exacerbate this. She will follow-up with me for a TOC appointment.  2. Hyperglycemia Jacqueline Turner's blood sugar was elevated in the ER. I will have her return for fasting labs to assess for whether there may be a diabetes issue.  - Hemoglobin A1c; Future - Glucose, random; Future  196, MD

## 2020-11-30 ENCOUNTER — Other Ambulatory Visit (INDEPENDENT_AMBULATORY_CARE_PROVIDER_SITE_OTHER): Payer: BC Managed Care – PPO

## 2020-11-30 DIAGNOSIS — R739 Hyperglycemia, unspecified: Secondary | ICD-10-CM | POA: Diagnosis not present

## 2020-11-30 LAB — HEMOGLOBIN A1C: Hgb A1c MFr Bld: 5 % (ref 4.6–6.5)

## 2020-11-30 LAB — GLUCOSE, RANDOM: Glucose, Bld: 83 mg/dL (ref 70–99)

## 2021-02-09 ENCOUNTER — Encounter: Payer: BC Managed Care – PPO | Admitting: Family Medicine

## 2021-02-10 ENCOUNTER — Ambulatory Visit: Payer: BC Managed Care – PPO | Admitting: Family Medicine

## 2021-02-10 ENCOUNTER — Other Ambulatory Visit: Payer: Self-pay

## 2021-02-10 ENCOUNTER — Encounter: Payer: Self-pay | Admitting: Family Medicine

## 2021-02-10 VITALS — BP 120/90 | HR 84 | Temp 97.3°F | Ht 66.0 in | Wt 195.4 lb

## 2021-02-10 DIAGNOSIS — Z23 Encounter for immunization: Secondary | ICD-10-CM | POA: Diagnosis not present

## 2021-02-10 DIAGNOSIS — K219 Gastro-esophageal reflux disease without esophagitis: Secondary | ICD-10-CM

## 2021-02-10 DIAGNOSIS — R87619 Unspecified abnormal cytological findings in specimens from cervix uteri: Secondary | ICD-10-CM

## 2021-02-10 DIAGNOSIS — E669 Obesity, unspecified: Secondary | ICD-10-CM | POA: Diagnosis not present

## 2021-02-10 DIAGNOSIS — F50819 Binge eating disorder, unspecified: Secondary | ICD-10-CM | POA: Insufficient documentation

## 2021-02-10 DIAGNOSIS — F5081 Binge eating disorder: Secondary | ICD-10-CM | POA: Insufficient documentation

## 2021-02-10 MED ORDER — PANTOPRAZOLE SODIUM 40 MG PO TBEC: 40.0000 mg | DELAYED_RELEASE_TABLET | Freq: Every day | ORAL | 11 refills | Status: DC

## 2021-02-10 NOTE — Progress Notes (Signed)
[facility]IMARY [facility]ARE [facility] [address] [place] [address] Dept: [phone] Dept Fax: [phone]  Transfer of Care Office Visit  Subjective:    Patient ID: Jacqueline Turner, female    DOB: 1980/12/18, 40 y.o..   MRN: [id number]  Chief Complaint  Patient presents with   Establish Care    TOC-Establish care    History of Present Illness:  Patient is in today to establish care. Ms. Eversley was born in [place]. She has a degree in fine arts with a concentration in ceramics from [employer]. 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 She has been married for 5 years. She has no children. She denies tobacco or drug use. She drinks socially.  Ms. Prestwood has a history of GERD. I had seen her in August related to an episode of chest pain that was felt to be due to GERD. She did improve on Protonix. She stopped this and some of her symptoms have returned. However, she prefers to not take the Protonix all the time. She plans to use this as needed through the [facility].  Ms. Cuddeback has a history of obesity. She is followed through a weight loss program at [facility]. She was initially managed on liraglutide. As she had been noted ot have a binge eating issue, she was also tried on Vyvanse. The Vyvanse was stopped after her episode of chest pain. As she was not consistent with the liraglutide injections, she has now been switched to semaglutide (Rybelsus) 14 mg daily. She notes 40 lobs. of weight loss over the past 7 months. Her goal weight is in the 160s and she does want to be healthy.  Ms. Tosh has a history of abnormal pap smears. She is seen by Dr. Rana Snare and is being followed closely related to these.  Past Medical History: Patient Active Problem List   Diagnosis Date Noted   Abnormal Pap smear of cervix 02/10/2021   Binge eating disorder 02/10/2021   Gastroesophageal reflux 11/29/2020   Migraine  09/02/2019   Seasonal allergies 09/02/2019   Obesity (BMI 30.0-34.9) 11/21/2006    History reviewed. No pertinent surgical history.  Family History  Problem Relation Age of Onset   Cancer Mother        breast cancer   Diabetes Father    Heart disease Father    Hypertension Father    Hyperlipidemia Father    Heart disease Paternal Uncle    Heart disease Paternal Uncle    Heart disease Paternal Uncle    Cancer Paternal Uncle        Pancreatic   Cancer Maternal Grandmother        Skin, cervical   Stroke Maternal Grandmother    Dementia Maternal Grandmother    Cancer Paternal Grandfather        Leukemia   Outpatient Medications Prior to Visit  Medication Sig Dispense Refill   cetirizine (ZYRTEC) 10 MG tablet Take by mouth.     Multiple Vitamins-Minerals (MULTIVITAMIN ADULT EXTRA C PO) multivitamin     Norethindrone Acetate-Ethinyl Estrad-FE (HAILEY 24 FE) 1-20 MG-MCG(24) tablet Hailey 24 Fe 1 mg-20 mcg (24)/75 mg (4) tablet  TAKE 1 TABLET BY MOUTH EVERY DAY     RYBELSUS 14 MG TABS Take 1 tablet by mouth daily.     Cholecalciferol (VITAMIN D) 125 MCG (5000 UT) CAPS  (Patient not taking: Reported on 02/10/2021)     liraglutide (VICTOZA) 18  MG/3ML SOPN Inject into the skin. (Patient not taking: Reported on 02/10/2021)     Lisdexamfetamine Dimesylate (VYVANSE PO) Take by mouth. (Patient not taking: Reported on 02/10/2021)     pantoprazole (PROTONIX) 40 MG tablet Take 1 tablet (40 mg total) by mouth daily. (Patient not taking: Reported on 02/10/2021) 30 tablet 1   No facility-administered medications prior to visit.   Allergies  Allergen Reactions   Sulfa Antibiotics     Other reaction(s): Unknown   Objective:   Today's Vitals   02/10/21 1007  BP: 120/90  Pulse: 84  Temp: (!) 97.3 F (36.3 C)  TempSrc: Temporal  SpO2: 98%  Weight: 195 lb 6.4 oz (88.6 kg)  Height: 5\' 6"  (1.676 m)   Body mass index is 31.54 kg/m.   General: Well developed, well nourished. No acute  distress. Psych: Alert and oriented. Normal mood and affect.  Health Maintenance Due  Topic Date Due   HIV Screening  Never done   Hepatitis C Screening  Never done   PAP SMEAR-Modifier  Never done   TETANUS/TDAP  03/05/2016   COVID-19 Vaccine (3 - Booster for Pfizer series) 08/21/2019      Assessment & Plan:   1. Gastroesophageal reflux disease, unspecified whether esophagitis present I recommend that Ms. Corriher consider going back on her PPI for the time being, as it does seem to control her symptoms better.  - pantoprazole (PROTONIX) 40 MG tablet; Take 1 tablet (40 mg total) by mouth daily.  Dispense: 30 tablet; Refill: 11  2. Obesity (BMI 30.0-34.9) Ms. Tabone is having success working with Clarene Duke and using semaglutide.  3. Abnormal cervical Papanicolaou smear, unspecified abnormal pap finding She will continue to follow with her GYN.    [facility], MD

## 2021-08-11 ENCOUNTER — Ambulatory Visit: Payer: BC Managed Care – PPO | Admitting: Family Medicine

## 2021-08-14 ENCOUNTER — Ambulatory Visit: Payer: BC Managed Care – PPO | Admitting: Family Medicine

## 2021-08-16 ENCOUNTER — Ambulatory Visit (INDEPENDENT_AMBULATORY_CARE_PROVIDER_SITE_OTHER): Payer: BC Managed Care – PPO | Admitting: Family Medicine

## 2021-08-16 VITALS — BP 118/74 | HR 74 | Temp 97.8°F | Ht 66.0 in | Wt 171.6 lb

## 2021-08-16 DIAGNOSIS — F5081 Binge eating disorder: Secondary | ICD-10-CM | POA: Diagnosis not present

## 2021-08-16 DIAGNOSIS — K219 Gastro-esophageal reflux disease without esophagitis: Secondary | ICD-10-CM | POA: Diagnosis not present

## 2021-08-16 DIAGNOSIS — E663 Overweight: Secondary | ICD-10-CM

## 2021-08-16 DIAGNOSIS — E781 Pure hyperglyceridemia: Secondary | ICD-10-CM

## 2021-08-16 DIAGNOSIS — R7401 Elevation of levels of liver transaminase levels: Secondary | ICD-10-CM

## 2021-08-16 DIAGNOSIS — Z1159 Encounter for screening for other viral diseases: Secondary | ICD-10-CM

## 2021-08-16 LAB — LIPID PANEL
Cholesterol: 158 mg/dL (ref 0–200)
HDL: 53.3 mg/dL (ref >39.00)
LDL Cholesterol: 86 mg/dL (ref 0–99)
NonHDL: 104.22
Total CHOL/HDL Ratio: 3
Triglycerides: 91 mg/dL (ref 0.0–149.0)
VLDL: 18.2 mg/dL (ref 0.0–40.0)

## 2021-08-16 LAB — COMPREHENSIVE METABOLIC PANEL
ALT: 9 U/L (ref 0–35)
AST: 11 U/L (ref 0–37)
Albumin: 4.1 g/dL (ref 3.5–5.2)
Alkaline Phosphatase: 34 U/L — ABNORMAL LOW (ref 39–117)
BUN: 11 mg/dL (ref 6–23)
CO2: 24 mEq/L (ref 19–32)
Calcium: 9.3 mg/dL (ref 8.4–10.5)
Chloride: 106 mEq/L (ref 96–112)
Creatinine, Ser: 0.76 mg/dL (ref 0.40–1.20)
GFR: 98.53 mL/min (ref >60.00)
Glucose, Bld: 90 mg/dL (ref 70–99)
Potassium: 4.6 mEq/L (ref 3.5–5.1)
Sodium: 138 mEq/L (ref 135–145)
Total Bilirubin: 0.5 mg/dL (ref 0.2–1.2)
Total Protein: 7.2 g/dL (ref 6.0–8.3)

## 2021-08-16 NOTE — Progress Notes (Signed)
?[facility] ?[facility] ?[place] ?[place] [address] ?Dept: [phone] ?Dept Fax: [phone] ? ?Chronic Care Office Visit ? ?Subjective:  ? ? Patient ID: Jacqueline Turner, female    DOB: November 29, 1980, 41 y.o..   MRN: [id number] ? ?Chief Complaint  ?Patient presents with  ? Follow-up  ?  6 month f/u.  No concerns.   Fasting today.    ? ? ?History of Present Illness: ? ?Patient is in today for reassessment of chronic medical issues. ? ?Ms. Bueso has a history of GERD. She continues to take this daily, noting that when she ahs tried to stop, she has had some dyspepsia return.  ?  ?Ms. Wishart has a history of obesity, with a maximum weight of ~ 240 lb. She is followed through a weight loss program at [facility]. She was initially managed on liraglutide. As she had been noted to have a binge eating issue, she was also tried on Vyvanse. The Vyvanse was stopped after her episode of chest pain in Aug 2022. Some of the binge eating issues had recurred, so now she is back on the Vyvanse. As she was not consistent with the liraglutide injections, she was switched to semaglutide (Rybelsus) 14 mg daily. Her goal weight is ~160 and she does want to be healthy. She is going to [facility] 2 days a week. She had previously been going to [employer] one day a week, but notes she has fallen off of this. ?  ?Past Medical History: ?Patient Active Problem List  ? Diagnosis Date Noted  ? Abnormal Pap smear of cervix 02/10/2021  ? Binge eating disorder 02/10/2021  ? Gastroesophageal reflux 11/29/2020  ? Migraine 09/02/2019  ? Seasonal allergies 09/02/2019  ? Overweight (BMI 25.0-29.9) 11/21/2006  ? ?No past surgical history on file. ? ?Family History  ?Problem Relation Age of Onset  ? Cancer Mother   ?     breast cancer  ? Diabetes Father   ? Heart disease Father   ? Hypertension Father   ? Hyperlipidemia Father   ? Heart disease Paternal Uncle   ? Heart disease Paternal Uncle    ? Heart disease Paternal Uncle   ? Cancer Paternal Uncle   ?     Pancreatic  ? Cancer Maternal Grandmother   ?     Skin, cervical  ? Stroke Maternal Grandmother   ? Dementia Maternal Grandmother   ? Cancer Paternal Grandfather   ?     Leukemia  ? ? ?Outpatient Medications Prior to Visit  ?Medication Sig Dispense Refill  ? Bacillus Coagulans-Inulin (PROBIOTIC) 1-250 BILLION-MG CAPS Probiotic    ? cetirizine (ZYRTEC) 10 MG tablet Take by mouth.    ? Multiple Vitamins-Minerals (MULTIVITAMIN ADULT EXTRA C PO) multivitamin    ? Norethindrone Acetate-Ethinyl Estrad-FE (HAILEY 24 FE) 1-20 MG-MCG(24) tablet Hailey 24 Fe 1 mg-20 mcg (24)/75 mg (4) tablet ? TAKE 1 TABLET BY MOUTH EVERY DAY    ? pantoprazole (PROTONIX) 40 MG tablet Take 1 tablet (40 mg total) by mouth daily. 30 tablet 11  ? RYBELSUS 14 MG TABS Take 1 tablet by mouth daily.    ? VYVANSE 50 MG capsule Take 50 mg by mouth daily.    ? ?No facility-administered medications prior to visit.  ? ?Allergies  ?Allergen Reactions  ? Sulfa Antibiotics   ?  Other reaction(s): Unknown  ? ?Objective:  ? ?Today's Vitals  ? 08/16/21 0820  ?BP: 118/74  ?Pulse: 74  ?Temp:  97.8 ?F (36.6 ?C)  ?TempSrc: Temporal  ?SpO2: 99%  ?Weight: 171 lb 9.6 oz (77.8 kg)  ?Height: 5\' 6"  (1.676 m)  ? ?Body mass index is 27.7 kg/m?.  ? ?General: Well developed, well nourished. No acute distress. ?Psych: Alert and oriented. Normal mood and affect. ? ?Health Maintenance Due  ?Topic Date Due  ? HIV Screening  Never done  ? Hepatitis C Screening  Never done  ?   ?Assessment & Plan:  ? ?1. Overweight (BMI 25.0-29.9) ?Ms. Frieling continues her efforts at weight loss, assisted with Rybelsus 14 mg daily. Her past BMI was 35.5 (Class 2 obesity). She is now down into the overweight range. She has had ~ 54 lbs of weight loss. We discussed her long range plan once she goes off of her semaglutide. I advised her to set a goal of 150 min of moderate-intensity (or 75 min of high-intensity) exercise weekly. ? ?-  Comprehensive metabolic panel ?- Lipid panel ? ?2. Binge eating disorder ?She will continue to follow with [facility] clinic. She will continue Vyvanse 50 mg daily for now. ? ?3. Gastroesophageal reflux disease, unspecified whether esophagitis present ?Currently on Protonix 40 mg daily. We discussed approaches to tapering of the PPI that could help her get off of the medication, as her symptoms when stopping suddenly could represent rebound hypersecretion of acid. I recommend she go to 20 mg daily for a month, and then transition to Pepcid AC. ? ?4. Elevated transaminase level ?Past elevated AST. Weight loss may have helped this to normalize. I will reassess her liver enzymes. ? ?- Comprehensive metabolic panel ?- HCV Ab w Reflex to Quant PCR ? ?5. Hypertriglyceridemia ?Past elevated triglycerides. We will reassess. ? ?- Lipid panel ? ?6. Encounter for hepatitis C screening test for low risk patient ? ?- HCV Ab w Reflex to Quant PCR ? ?Return in about 1 year (around 08/17/2022) for Reassessment.  ? ?Haydee Salter, MD ?

## 2021-08-17 LAB — HCV INTERPRETATION

## 2021-08-17 LAB — HCV AB W REFLEX TO QUANT PCR: HCV Ab: NONREACTIVE

## 2021-11-03 ENCOUNTER — Other Ambulatory Visit: Payer: Self-pay

## 2021-11-03 ENCOUNTER — Observation Stay (HOSPITAL_BASED_OUTPATIENT_CLINIC_OR_DEPARTMENT_OTHER)
Admission: EM | Admit: 2021-11-03 | Discharge: 2021-11-04 | Disposition: A | Discharge status: Home / Self Care | Payer: BC Managed Care – PPO | Attending: Surgery | Admitting: Surgery

## 2021-11-03 ENCOUNTER — Encounter (HOSPITAL_BASED_OUTPATIENT_CLINIC_OR_DEPARTMENT_OTHER): Payer: Self-pay | Admitting: Urology

## 2021-11-03 ENCOUNTER — Emergency Department (HOSPITAL_BASED_OUTPATIENT_CLINIC_OR_DEPARTMENT_OTHER): Payer: BC Managed Care – PPO

## 2021-11-03 DIAGNOSIS — K8012 Calculus of gallbladder with acute and chronic cholecystitis without obstruction: Principal | ICD-10-CM | POA: Insufficient documentation

## 2021-11-03 DIAGNOSIS — R1011 Right upper quadrant pain: Secondary | ICD-10-CM | POA: Diagnosis present

## 2021-11-03 DIAGNOSIS — R8271 Bacteriuria: Secondary | ICD-10-CM | POA: Diagnosis not present

## 2021-11-03 DIAGNOSIS — R1013 Epigastric pain: Secondary | ICD-10-CM | POA: Diagnosis not present

## 2021-11-03 DIAGNOSIS — Z79899 Other long term (current) drug therapy: Secondary | ICD-10-CM | POA: Insufficient documentation

## 2021-11-03 DIAGNOSIS — K810 Acute cholecystitis: Secondary | ICD-10-CM | POA: Diagnosis present

## 2021-11-03 HISTORY — DX: Attention-deficit hyperactivity disorder, unspecified type: F90.9

## 2021-11-03 LAB — COMPREHENSIVE METABOLIC PANEL
ALT: 15 U/L (ref 0–44)
AST: 16 U/L (ref 15–41)
Albumin: 4.2 g/dL (ref 3.5–5.0)
Alkaline Phosphatase: 35 U/L — ABNORMAL LOW (ref 38–126)
Anion gap: 7 (ref 5–15)
BUN: 11 mg/dL (ref 6–20)
CO2: 24 mmol/L (ref 22–32)
Calcium: 9.4 mg/dL (ref 8.9–10.3)
Chloride: 108 mmol/L (ref 98–111)
Creatinine, Ser: 0.71 mg/dL (ref 0.44–1.00)
GFR, Estimated: >60 mL/min (ref >60)
Glucose, Bld: 104 mg/dL — ABNORMAL HIGH (ref 70–99)
Potassium: 4 mmol/L (ref 3.5–5.1)
Sodium: 139 mmol/L (ref 135–145)
Total Bilirubin: 0.8 mg/dL (ref 0.3–1.2)
Total Protein: 8.2 g/dL — ABNORMAL HIGH (ref 6.5–8.1)

## 2021-11-03 LAB — URINALYSIS, MICROSCOPIC (REFLEX)

## 2021-11-03 LAB — URINALYSIS, ROUTINE W REFLEX MICROSCOPIC
Bilirubin Urine: NEGATIVE
Glucose, UA: NEGATIVE mg/dL
Ketones, ur: >=80 mg/dL — AB
Leukocytes,Ua: NEGATIVE
Nitrite: NEGATIVE
Protein, ur: NEGATIVE mg/dL
Specific Gravity, Urine: 1.025 (ref 1.005–1.030)
pH: 7 (ref 5.0–8.0)

## 2021-11-03 LAB — CBC
HCT: 40.3 % (ref 36.0–46.0)
Hemoglobin: 13.9 g/dL (ref 12.0–15.0)
MCH: 31.7 pg (ref 26.0–34.0)
MCHC: 34.5 g/dL (ref 30.0–36.0)
MCV: 91.8 fL (ref 80.0–100.0)
Platelets: 324 10*3/uL (ref 150–400)
RBC: 4.39 MIL/uL (ref 3.87–5.11)
RDW: 12.3 % (ref 11.5–15.5)
WBC: 10 10*3/uL (ref 4.0–10.5)
nRBC: 0 % (ref 0.0–0.2)

## 2021-11-03 LAB — PREGNANCY, URINE: Preg Test, Ur: NEGATIVE

## 2021-11-03 LAB — LIPASE, BLOOD: Lipase: 22 U/L (ref 11–51)

## 2021-11-03 MED ORDER — MORPHINE SULFATE (PF) 4 MG/ML IV SOLN
4.0000 mg | Freq: Once | INTRAVENOUS | Status: AC
  Administered 2021-11-03: 4 mg via INTRAVENOUS
  Filled 2021-11-03: qty 1

## 2021-11-03 MED ORDER — LACTATED RINGERS IV BOLUS
1000.0000 mL | Freq: Once | INTRAVENOUS | Status: AC
  Administered 2021-11-03: 1000 mL via INTRAVENOUS

## 2021-11-03 MED ORDER — ONDANSETRON HCL 4 MG/2ML IJ SOLN
4.0000 mg | Freq: Once | INTRAMUSCULAR | Status: AC | PRN
  Administered 2021-11-03: 4 mg via INTRAVENOUS
  Filled 2021-11-03: qty 2

## 2021-11-03 MED ORDER — ONDANSETRON 4 MG PO TBDP
4.0000 mg | ORAL_TABLET | Freq: Once | ORAL | Status: AC | PRN
  Administered 2021-11-03: 4 mg via ORAL
  Filled 2021-11-03: qty 1

## 2021-11-03 MED ORDER — IOHEXOL 300 MG/ML  SOLN
100.0000 mL | Freq: Once | INTRAMUSCULAR | Status: AC | PRN
  Administered 2021-11-03: 100 mL via INTRAVENOUS

## 2021-11-03 NOTE — ED Notes (Signed)
Unable to obtain labs at this time will try when in pt room

## 2021-11-03 NOTE — ED Provider Notes (Signed)
[facility] EMERGENCY DEPARTMENT Provider Note   CSN: [id number] Arrival date & time: 11/03/21  1927     History {Add pertinent medical, surgical, social history, OB history to HPI:1} Chief Complaint  Patient presents with   Abdominal Pain    Cynda Skyylar Kopf is a 41 y.o. female  Back pain since yesterday, occasionally more right sided,   Epigastric pain, 3 vomiting, 3 diarrhea.   No urinary symptoms.    No fevers  No sick contacts.   No discharge.   No new medications  Had chineese take out yesterday,   HPI     Home Medications Prior to Admission medications   Medication Sig Start Date End Date Taking? Authorizing Provider  Bacillus Coagulans-Inulin (PROBIOTIC) 1-250 BILLION-MG CAPS Probiotic    [provider]  cetirizine (ZYRTEC) 10 MG tablet Take by mouth.    [provider]  Multiple Vitamins-Minerals (MULTIVITAMIN ADULT EXTRA C PO) multivitamin    [provider]  Norethindrone Acetate-Ethinyl Estrad-FE (HAILEY 24 FE) 1-20 MG-MCG(24) tablet Hailey 24 Fe 1 mg-20 mcg (24)/75 mg (4) tablet  TAKE 1 TABLET BY MOUTH EVERY DAY    [provider]  pantoprazole (PROTONIX) 40 MG tablet Take 1 tablet (40 mg total) by mouth daily. 02/10/21 02/10/22  Loyola Mast, MD  RYBELSUS 14 MG TABS Take 1 tablet by mouth daily. 01/13/21   [provider]  VYVANSE 50 MG capsule Take 50 mg by mouth daily. 07/22/21   [provider]      Allergies    Sulfa antibiotics    Review of Systems   Review of Systems  Physical Exam Updated Vital Signs BP (!) 138/94   Pulse (!) 53   Temp 99 F (37.2 C) (Oral)   Resp 16   Ht 5\' 6"  (1.676 m)   Wt 77.8 kg   LMP 10/14/2021   SpO2 100%   BMI 27.68 kg/m  Physical Exam  ED Results / Procedures / Treatments   Labs (all labs ordered are listed, but only abnormal results are displayed) Labs Reviewed  COMPREHENSIVE METABOLIC PANEL - Abnormal; Notable for the following  components:      Result Value   Glucose, Bld 104 (*)    Total Protein 8.2 (*)    Alkaline Phosphatase 35 (*)    All other components within normal limits  URINALYSIS, ROUTINE W REFLEX MICROSCOPIC - Abnormal; Notable for the following components:   APPearance TURBID (*)    Hgb urine dipstick TRACE (*)    Ketones, ur >=80 (*)    All other components within normal limits  URINALYSIS, MICROSCOPIC (REFLEX) - Abnormal; Notable for the following components:   Bacteria, UA MANY (*)    All other components within normal limits  LIPASE, BLOOD  CBC  PREGNANCY, URINE    EKG None  Radiology No results found.  Procedures Procedures  {Document cardiac monitor, telemetry assessment procedure when appropriate:1}  Medications Ordered in ED Medications  ondansetron (ZOFRAN-ODT) disintegrating tablet 4 mg (4 mg Oral Given 11/03/21 2000)    ED Course/ Medical Decision Making/ A&P                           Medical Decision Making Amount and/or Complexity of Data Reviewed Labs: ordered.  Risk Prescription drug management.   ***  {Document critical care time when appropriate:1} {Document review of labs and clinical decision tools ie heart score, Chads2Vasc2 etc:1}  {Document your independent  review of radiology images, and any outside records:1} {Document your discussion with family members, caretakers, and with consultants:1} {Document social determinants of health affecting pt's care:1} {Document your decision making why or why not admission, treatments were needed:1} Final Clinical Impression(s) / ED Diagnoses Final diagnoses:  None    Rx / DC Orders ED Discharge Orders     None

## 2021-11-03 NOTE — ED Triage Notes (Signed)
Pt states epigastric pain radiating to back that started this morning  Reports N/V/D Denies fever  Went to UC and sent to ER for further eval

## 2021-11-03 NOTE — ED Notes (Signed)
Attempt at IV x 1 with no result, tech to attempt lab draw

## 2021-11-04 ENCOUNTER — Encounter (HOSPITAL_COMMUNITY)
Admission: EM | Disposition: A | Discharge status: Home / Self Care | Payer: Self-pay | Source: Home / Self Care | Attending: Emergency Medicine

## 2021-11-04 ENCOUNTER — Observation Stay (HOSPITAL_COMMUNITY): Payer: BC Managed Care – PPO | Admitting: Anesthesiology

## 2021-11-04 ENCOUNTER — Other Ambulatory Visit: Payer: Self-pay

## 2021-11-04 ENCOUNTER — Emergency Department (HOSPITAL_COMMUNITY): Payer: BC Managed Care – PPO

## 2021-11-04 ENCOUNTER — Encounter (HOSPITAL_COMMUNITY): Payer: Self-pay | Admitting: Surgery

## 2021-11-04 DIAGNOSIS — Z79899 Other long term (current) drug therapy: Secondary | ICD-10-CM | POA: Diagnosis not present

## 2021-11-04 DIAGNOSIS — K8012 Calculus of gallbladder with acute and chronic cholecystitis without obstruction: Secondary | ICD-10-CM | POA: Diagnosis not present

## 2021-11-04 DIAGNOSIS — R1011 Right upper quadrant pain: Secondary | ICD-10-CM | POA: Diagnosis present

## 2021-11-04 DIAGNOSIS — R8271 Bacteriuria: Secondary | ICD-10-CM | POA: Diagnosis not present

## 2021-11-04 DIAGNOSIS — R1013 Epigastric pain: Secondary | ICD-10-CM | POA: Diagnosis not present

## 2021-11-04 DIAGNOSIS — K810 Acute cholecystitis: Secondary | ICD-10-CM | POA: Diagnosis present

## 2021-11-04 HISTORY — PX: CHOLECYSTECTOMY: SHX55

## 2021-11-04 SURGERY — LAPAROSCOPIC CHOLECYSTECTOMY WITH INTRAOPERATIVE CHOLANGIOGRAM
Anesthesia: General | Site: Abdomen

## 2021-11-04 MED ORDER — LIDOCAINE 2% (20 MG/ML) 5 ML SYRINGE
INTRAMUSCULAR | Status: DC | PRN
  Administered 2021-11-04: 40 mg via INTRAVENOUS

## 2021-11-04 MED ORDER — SEMAGLUTIDE 14 MG PO TABS: 1.0000 | ORAL_TABLET | Freq: Every day | ORAL | Status: DC

## 2021-11-04 MED ORDER — HEPARIN SODIUM (PORCINE) 5000 UNIT/ML IJ SOLN
5000.0000 [IU] | Freq: Three times a day (TID) | INTRAMUSCULAR | Status: DC
Start: 2021-11-04 — End: 2021-11-04

## 2021-11-04 MED ORDER — LACTATED RINGERS IV SOLN: INTRAVENOUS | Status: DC

## 2021-11-04 MED ORDER — AMISULPRIDE (ANTIEMETIC) 5 MG/2ML IV SOLN: 10.0000 mg | Freq: Once | INTRAVENOUS | Status: DC | PRN

## 2021-11-04 MED ORDER — ACETAMINOPHEN 160 MG/5ML PO SOLN: 325.0000 mg | ORAL | Status: DC | PRN

## 2021-11-04 MED ORDER — MIDAZOLAM HCL 2 MG/2ML IJ SOLN
INTRAMUSCULAR | Status: AC
  Filled 2021-11-04: qty 2

## 2021-11-04 MED ORDER — SCOPOLAMINE 1 MG/3DAYS TD PT72
1.0000 | MEDICATED_PATCH | TRANSDERMAL | Status: DC
  Administered 2021-11-04: 1.5 mg via TRANSDERMAL

## 2021-11-04 MED ORDER — CHLORHEXIDINE GLUCONATE 0.12 % MT SOLN
OROMUCOSAL | Status: AC
  Administered 2021-11-04: 15 mL via OROMUCOSAL
  Filled 2021-11-04: qty 15

## 2021-11-04 MED ORDER — OXYCODONE HCL 5 MG PO TABS: 5.0000 mg | ORAL_TABLET | Freq: Four times a day (QID) | ORAL | Status: DC | PRN

## 2021-11-04 MED ORDER — INDOCYANINE GREEN 25 MG IV SOLR
INTRAVENOUS | Status: DC | PRN
  Administered 2021-11-04: 1.25 mg via INTRAVENOUS

## 2021-11-04 MED ORDER — SODIUM CHLORIDE 0.9 % IV SOLN
2.0000 g | INTRAVENOUS | Status: DC
  Administered 2021-11-04: 2 g via INTRAVENOUS
  Filled 2021-11-04: qty 20

## 2021-11-04 MED ORDER — SUCCINYLCHOLINE CHLORIDE 200 MG/10ML IV SOSY
PREFILLED_SYRINGE | INTRAVENOUS | Status: AC
  Filled 2021-11-04: qty 10

## 2021-11-04 MED ORDER — MIDAZOLAM HCL 2 MG/2ML IJ SOLN
2.0000 mg | Freq: Once | INTRAMUSCULAR | Status: AC
Start: 2021-11-04 — End: 2021-11-04

## 2021-11-04 MED ORDER — SCOPOLAMINE 1 MG/3DAYS TD PT72
MEDICATED_PATCH | TRANSDERMAL | Status: AC
  Filled 2021-11-04: qty 1

## 2021-11-04 MED ORDER — DEXAMETHASONE SODIUM PHOSPHATE 10 MG/ML IJ SOLN
INTRAMUSCULAR | Status: DC | PRN
  Administered 2021-11-04: 8 mg via INTRAVENOUS

## 2021-11-04 MED ORDER — OXYCODONE HCL 5 MG/5ML PO SOLN: 5.0000 mg | Freq: Once | ORAL | Status: DC | PRN

## 2021-11-04 MED ORDER — DIPHENHYDRAMINE HCL 12.5 MG/5ML PO ELIX: 12.5000 mg | ORAL_SOLUTION | Freq: Four times a day (QID) | ORAL | Status: DC | PRN

## 2021-11-04 MED ORDER — DEXAMETHASONE SODIUM PHOSPHATE 10 MG/ML IJ SOLN
INTRAMUSCULAR | Status: AC
Start: 2021-11-04 — End: ?
  Filled 2021-11-04: qty 1

## 2021-11-04 MED ORDER — LIDOCAINE 2% (20 MG/ML) 5 ML SYRINGE
INTRAMUSCULAR | Status: AC
Start: 2021-11-04 — End: ?
  Filled 2021-11-04: qty 10

## 2021-11-04 MED ORDER — ORAL CARE MOUTH RINSE: 15.0000 mL | Freq: Once | OROMUCOSAL | Status: AC

## 2021-11-04 MED ORDER — ENOXAPARIN SODIUM 40 MG/0.4ML IJ SOSY: 40.0000 mg | PREFILLED_SYRINGE | INTRAMUSCULAR | Status: DC

## 2021-11-04 MED ORDER — FENTANYL CITRATE (PF) 100 MCG/2ML IJ SOLN: 50.0000 ug | Freq: Once | INTRAMUSCULAR | Status: AC

## 2021-11-04 MED ORDER — 0.9 % SODIUM CHLORIDE (POUR BTL) OPTIME
TOPICAL | Status: DC | PRN
  Administered 2021-11-04: 1000 mL

## 2021-11-04 MED ORDER — MIDAZOLAM HCL 5 MG/5ML IJ SOLN
INTRAMUSCULAR | Status: DC | PRN
  Administered 2021-11-04: 2 mg via INTRAVENOUS

## 2021-11-04 MED ORDER — SODIUM CHLORIDE 0.9 % IV SOLN: INTRAVENOUS | Status: DC

## 2021-11-04 MED ORDER — PHENYLEPHRINE 80 MCG/ML (10ML) SYRINGE FOR IV PUSH (FOR BLOOD PRESSURE SUPPORT)
PREFILLED_SYRINGE | INTRAVENOUS | Status: AC
Start: 2021-11-04 — End: ?
  Filled 2021-11-04: qty 40

## 2021-11-04 MED ORDER — SPY AGENT GREEN - (INDOCYANINE FOR INJECTION)
1.2500 mg | Freq: Once | INTRAMUSCULAR | Status: DC
  Filled 2021-11-04: qty 10

## 2021-11-04 MED ORDER — CEFAZOLIN SODIUM 1 G IJ SOLR
INTRAMUSCULAR | Status: AC
  Filled 2021-11-04: qty 40

## 2021-11-04 MED ORDER — ONDANSETRON HCL 4 MG/2ML IJ SOLN
INTRAMUSCULAR | Status: DC | PRN
  Administered 2021-11-04: 4 mg via INTRAVENOUS

## 2021-11-04 MED ORDER — PROMETHAZINE HCL 25 MG/ML IJ SOLN: 6.2500 mg | INTRAMUSCULAR | Status: DC | PRN

## 2021-11-04 MED ORDER — ACETAMINOPHEN 325 MG PO TABS: 325.0000 mg | ORAL_TABLET | ORAL | Status: DC | PRN

## 2021-11-04 MED ORDER — FENTANYL CITRATE (PF) 250 MCG/5ML IJ SOLN
INTRAMUSCULAR | Status: AC
  Filled 2021-11-04: qty 5

## 2021-11-04 MED ORDER — CHLORHEXIDINE GLUCONATE 0.12 % MT SOLN: 15.0000 mL | Freq: Once | OROMUCOSAL | Status: AC

## 2021-11-04 MED ORDER — BUPIVACAINE-EPINEPHRINE 0.25% -1:200000 IJ SOLN
INTRAMUSCULAR | Status: DC | PRN
  Administered 2021-11-04: 10 mL

## 2021-11-04 MED ORDER — DIPHENHYDRAMINE HCL 50 MG/ML IJ SOLN: 12.5000 mg | Freq: Four times a day (QID) | INTRAMUSCULAR | Status: DC | PRN

## 2021-11-04 MED ORDER — PROPOFOL 10 MG/ML IV BOLUS
INTRAVENOUS | Status: DC | PRN
  Administered 2021-11-04: 130 mg via INTRAVENOUS
  Administered 2021-11-04: 50 mg via INTRAVENOUS

## 2021-11-04 MED ORDER — OXYCODONE HCL 5 MG PO TABS
5.0000 mg | ORAL_TABLET | ORAL | Status: DC | PRN
  Administered 2021-11-04 (×2): 10 mg via ORAL
  Filled 2021-11-04 (×2): qty 2

## 2021-11-04 MED ORDER — ONDANSETRON HCL 4 MG/2ML IJ SOLN
4.0000 mg | Freq: Four times a day (QID) | INTRAMUSCULAR | Status: DC | PRN
  Administered 2021-11-04: 4 mg via INTRAVENOUS
  Filled 2021-11-04: qty 2

## 2021-11-04 MED ORDER — SODIUM CHLORIDE 0.9 % IR SOLN
Status: DC | PRN
  Administered 2021-11-04: 1000 mL

## 2021-11-04 MED ORDER — ONDANSETRON 4 MG PO TBDP: 4.0000 mg | ORAL_TABLET | Freq: Four times a day (QID) | ORAL | Status: DC | PRN

## 2021-11-04 MED ORDER — SUGAMMADEX SODIUM 200 MG/2ML IV SOLN
INTRAVENOUS | Status: DC | PRN
  Administered 2021-11-04: 200 mg via INTRAVENOUS

## 2021-11-04 MED ORDER — BUPIVACAINE-EPINEPHRINE (PF) 0.25% -1:200000 IJ SOLN
INTRAMUSCULAR | Status: AC
  Filled 2021-11-04: qty 30

## 2021-11-04 MED ORDER — MIDAZOLAM HCL 2 MG/2ML IJ SOLN
INTRAMUSCULAR | Status: AC
  Administered 2021-11-04: 2 mg via INTRAVENOUS
  Filled 2021-11-04: qty 2

## 2021-11-04 MED ORDER — ACETAMINOPHEN 500 MG PO TABS: 1000.0000 mg | ORAL_TABLET | ORAL | Status: DC

## 2021-11-04 MED ORDER — TRAMADOL HCL 50 MG PO TABS: 100.0000 mg | ORAL_TABLET | Freq: Four times a day (QID) | ORAL | 0 refills | Status: DC | PRN

## 2021-11-04 MED ORDER — MORPHINE SULFATE (PF) 2 MG/ML IV SOLN: 2.0000 mg | INTRAVENOUS | Status: DC | PRN

## 2021-11-04 MED ORDER — CEFAZOLIN SODIUM-DEXTROSE 2-3 GM-%(50ML) IV SOLR
INTRAVENOUS | Status: DC | PRN
  Administered 2021-11-04: 2 g via INTRAVENOUS

## 2021-11-04 MED ORDER — FENTANYL CITRATE (PF) 100 MCG/2ML IJ SOLN
INTRAMUSCULAR | Status: AC
  Administered 2021-11-04: 50 ug via INTRAVENOUS
  Filled 2021-11-04: qty 2

## 2021-11-04 MED ORDER — MORPHINE SULFATE (PF) 4 MG/ML IV SOLN
4.0000 mg | Freq: Once | INTRAVENOUS | Status: AC
  Administered 2021-11-04: 4 mg via INTRAVENOUS
  Filled 2021-11-04: qty 1

## 2021-11-04 MED ORDER — FENTANYL CITRATE (PF) 250 MCG/5ML IJ SOLN
INTRAMUSCULAR | Status: DC | PRN
  Administered 2021-11-04: 100 ug via INTRAVENOUS
  Administered 2021-11-04: 50 ug via INTRAVENOUS
  Administered 2021-11-04: 100 ug via INTRAVENOUS

## 2021-11-04 MED ORDER — KETOROLAC TROMETHAMINE 15 MG/ML IJ SOLN
15.0000 mg | Freq: Three times a day (TID) | INTRAMUSCULAR | Status: DC | PRN
  Administered 2021-11-04: 15 mg via INTRAVENOUS
  Filled 2021-11-04: qty 1

## 2021-11-04 MED ORDER — ACETAMINOPHEN 10 MG/ML IV SOLN: 1000.0000 mg | Freq: Once | INTRAVENOUS | Status: DC | PRN

## 2021-11-04 MED ORDER — HEMOSTATIC AGENTS (NO CHARGE) OPTIME
TOPICAL | Status: DC | PRN
  Administered 2021-11-04: 1 via TOPICAL

## 2021-11-04 MED ORDER — ACETAMINOPHEN 500 MG PO TABS
1000.0000 mg | ORAL_TABLET | Freq: Four times a day (QID) | ORAL | Status: DC
  Administered 2021-11-04: 1000 mg via ORAL
  Filled 2021-11-04: qty 2

## 2021-11-04 MED ORDER — PANTOPRAZOLE SODIUM 40 MG PO TBEC
40.0000 mg | DELAYED_RELEASE_TABLET | Freq: Every day | ORAL | Status: DC
  Administered 2021-11-04: 40 mg via ORAL
  Filled 2021-11-04: qty 1

## 2021-11-04 MED ORDER — SUCCINYLCHOLINE CHLORIDE 200 MG/10ML IV SOSY
PREFILLED_SYRINGE | INTRAVENOUS | Status: DC | PRN
  Administered 2021-11-04: 140 mg via INTRAVENOUS

## 2021-11-04 MED ORDER — ROCURONIUM BROMIDE 10 MG/ML (PF) SYRINGE
PREFILLED_SYRINGE | INTRAVENOUS | Status: DC | PRN
  Administered 2021-11-04: 50 mg via INTRAVENOUS

## 2021-11-04 MED ORDER — OXYCODONE HCL 5 MG PO TABS: 5.0000 mg | ORAL_TABLET | Freq: Once | ORAL | Status: DC | PRN

## 2021-11-04 MED ORDER — BUPIVACAINE-EPINEPHRINE (PF) 0.25% -1:200000 IJ SOLN
INTRAMUSCULAR | Status: DC | PRN
  Administered 2021-11-04 (×2): 30 mL

## 2021-11-04 MED ORDER — HYDROMORPHONE HCL 1 MG/ML IJ SOLN
0.5000 mg | INTRAMUSCULAR | Status: DC | PRN
  Administered 2021-11-04: 0.5 mg via INTRAVENOUS
  Filled 2021-11-04: qty 1

## 2021-11-04 MED ORDER — ROCURONIUM BROMIDE 10 MG/ML (PF) SYRINGE
PREFILLED_SYRINGE | INTRAVENOUS | Status: AC
  Filled 2021-11-04: qty 20

## 2021-11-04 MED ORDER — ONDANSETRON HCL 4 MG/2ML IJ SOLN
INTRAMUSCULAR | Status: AC
  Filled 2021-11-04: qty 2

## 2021-11-04 MED ORDER — FENTANYL CITRATE (PF) 100 MCG/2ML IJ SOLN: 25.0000 ug | INTRAMUSCULAR | Status: DC | PRN

## 2021-11-04 MED ORDER — BUPIVACAINE-EPINEPHRINE (PF) 0.25% -1:200000 IJ SOLN
INTRAMUSCULAR | Status: AC
Start: 2021-11-04 — End: ?
  Filled 2021-11-04: qty 30

## 2021-11-04 SURGICAL SUPPLY — 46 items
ADH SKN CLS APL DERMABOND .7 (GAUZE/BANDAGES/DRESSINGS) ×1
APL PRP STRL LF DISP 70% ISPRP (MISCELLANEOUS) ×1
APPLIER CLIP 5 13 M/L LIGAMAX5 (MISCELLANEOUS) ×2
APR CLP MED LRG 5 ANG JAW (MISCELLANEOUS) ×1
BAG COUNTER SPONGE SURGICOUNT (BAG) ×3 IMPLANT
BAG SPEC RTRVL 10 TROC 200 (ENDOMECHANICALS) ×1
BAG SPNG CNTER NS LX DISP (BAG) ×1
BLADE CLIPPER SURG (BLADE) IMPLANT
CANISTER SUCT 3000ML PPV (MISCELLANEOUS) ×3 IMPLANT
CHLORAPREP W/TINT 26 (MISCELLANEOUS) ×3 IMPLANT
CLIP APPLIE 5 13 M/L LIGAMAX5 (MISCELLANEOUS) ×2 IMPLANT
COVER MAYO STAND STRL (DRAPES) IMPLANT
COVER SURGICAL LIGHT HANDLE (MISCELLANEOUS) ×3 IMPLANT
DERMABOND ADVANCED (GAUZE/BANDAGES/DRESSINGS) ×1
DERMABOND ADVANCED .7 DNX12 (GAUZE/BANDAGES/DRESSINGS) ×2 IMPLANT
DRAPE C-ARM 42X120 X-RAY (DRAPES) IMPLANT
ELECT REM PT RETURN 9FT ADLT (ELECTROSURGICAL) ×2
ELECTRODE REM PT RTRN 9FT ADLT (ELECTROSURGICAL) ×2 IMPLANT
GLOVE BIO SURGEON STRL SZ7 (GLOVE) ×3 IMPLANT
GLOVE BIOGEL PI IND STRL 7.5 (GLOVE) ×2 IMPLANT
GLOVE BIOGEL PI INDICATOR 7.5 (GLOVE) ×1
GOWN STRL REUS W/ TWL LRG LVL3 (GOWN DISPOSABLE) ×6 IMPLANT
GOWN STRL REUS W/TWL LRG LVL3 (GOWN DISPOSABLE) ×6
GRASPER SUT TROCAR 14GX15 (MISCELLANEOUS) ×3 IMPLANT
HEMOSTAT SNOW SURGICEL 2X4 (HEMOSTASIS) ×1 IMPLANT
KIT BASIN OR (CUSTOM PROCEDURE TRAY) ×3 IMPLANT
KIT TURNOVER KIT B (KITS) ×3 IMPLANT
NS IRRIG 1000ML POUR BTL (IV SOLUTION) ×3 IMPLANT
PAD ARMBOARD 7.5X6 YLW CONV (MISCELLANEOUS) ×3 IMPLANT
POUCH RETRIEVAL ECOSAC 10 (ENDOMECHANICALS) ×2 IMPLANT
POUCH RETRIEVAL ECOSAC 10MM (ENDOMECHANICALS) ×2
SCISSORS LAP 5X35 DISP (ENDOMECHANICALS) ×3 IMPLANT
SET CHOLANGIOGRAPH 5 50 .035 (SET/KITS/TRAYS/PACK) IMPLANT
SET IRRIG TUBING LAPAROSCOPIC (IRRIGATION / IRRIGATOR) ×3 IMPLANT
SET TUBE SMOKE EVAC HIGH FLOW (TUBING) ×3 IMPLANT
SLEEVE ENDOPATH XCEL 5M (ENDOMECHANICALS) ×6 IMPLANT
SPECIMEN JAR SMALL (MISCELLANEOUS) ×3 IMPLANT
STRIP CLOSURE SKIN 1/2X4 (GAUZE/BANDAGES/DRESSINGS) ×3 IMPLANT
SUT MNCRL AB 4-0 PS2 18 (SUTURE) ×3 IMPLANT
SUT VICRYL 0 UR6 27IN ABS (SUTURE) ×3 IMPLANT
TOWEL GREEN STERILE (TOWEL DISPOSABLE) ×3 IMPLANT
TOWEL GREEN STERILE FF (TOWEL DISPOSABLE) ×3 IMPLANT
TRAY LAPAROSCOPIC MC (CUSTOM PROCEDURE TRAY) ×3 IMPLANT
TROCAR XCEL BLUNT TIP 100MML (ENDOMECHANICALS) ×3 IMPLANT
TROCAR Z-THREAD OPTICAL 5X100M (TROCAR) ×2 IMPLANT
WATER STERILE IRR 1000ML POUR (IV SOLUTION) ×2 IMPLANT

## 2021-11-04 NOTE — Op Note (Signed)
Preoperative diagnosis: acute cholecystitis Postoperative diagnosis: same as above Procedure: Laparoscopic cholecystectomy with ICG dye Surgeon: Dr. Harden Mo Anesthesia: General Estimated blood loss: minimal Specimens: Gallbladder to pathology Drains: None Complications: None Sponge needle count was correct completion Decision to recovery stable condition  Indications:  40 yof with ruq pain, [place] c/w cholecystitis. We discussed lap chole.  Procedure: After informed consent was obtained the patient was taken to the operating room.  She was given antibiotics.  SCDs were in place.  She was given indocyanine green by IV.  She had SCDs in place.  She was placed under general anesthesia without complication.  She was prepped and draped in the standard sterile surgical fashion.  I infiltrated Marcaine below the umbilicus and made an incision. I incised the fascia.  Peritoneum was entered bluntly.  I then placed a 0 vicryl pursestring suture and inserted a hasson trocar. The abdomen was insufflated to 15 mm Hg pressure.  I then inserted 3 additional 5 mm trocars in the epigastrium and right side of the abdomen.  The gallbladder was then identified. she had acute cholecystitis and the gallbladder could not be grasped as it was tense. I had to aspirate it to grasp it.  It was then retracted cephalad and lateral.  I was able to dissect the triangle of Calot. I obtained the critical view of safety.  I then was able to identify the common bile duct as well as the common hepatic duct with the ICG dye confirming that I had the cystic duct.  I clipped the artery three times then divided it leaving two clips in place.  The cystic duct was clearly visualized and clipped three times.   I divided the cystic duct leaving two clips in place. I then placed the gallbladder in a retrieval bag.  Hemostasis was obtained.  I irrigated until it was clear.   I then removed the gallbladder in the retrieval bag.  I then removed  the hasson trocar. I tied the pursestring down.  I also closed this trocar site using the suture passer and 0 Vicryl suture.   This obliterated the defect.  I then removed the remaining trocars and desufflated the abdomen.  These were closed with 4-0 Monocryl and glue.  She tolerated this well was extubated and transferred to recovery stable.

## 2021-11-04 NOTE — Discharge Instructions (Signed)
[facility] LAPAROSCOPIC SURGERY: POST OP INSTRUCTIONS  Always review your discharge instruction sheet given to you by the facility where your surgery was performed. IF YOU HAVE DISABILITY OR FAMILY LEAVE FORMS, YOU MUST BRING THEM TO THE OFFICE FOR PROCESSING.   DO NOT GIVE THEM TO YOUR DOCTOR.  A prescription for pain medication may be given to you upon discharge.  Take your pain medication as prescribed, if needed.  If narcotic pain medicine is not needed, then you may take acetaminophen (Tylenol), naprosyn (Alleve), or ibuprofen (Advil) as needed. Take your usually prescribed medications unless otherwise directed. If you need a refill on your pain medication, please contact your pharmacy.  They will contact our office to request authorization. Prescriptions will not be filled after 5pm or on week-ends. You should follow a light diet the first few days after arrival home, such as soup and crackers, etc.  Be sure to include lots of fluids daily. Most patients will experience some swelling and bruising in the area of the incisions.  Ice packs will help.  Swelling and bruising can take several days to resolve.  It is common to experience some constipation if taking pain medication after surgery.  Increasing fluid intake and taking a stool softener (such as Colace) will usually help or prevent this problem from occurring.  A mild laxative (Milk of Magnesia or Miralax) should be taken according to package instructions if there are no bowel movements after 48 hours. Unless discharge instructions indicate otherwise, you may remove your bandages 48 hours after surgery, and you may shower at that time.  You may have steri-strips (small skin tapes) in place directly over the incision.  These strips should be left on the skin for 7-10 days.  If your surgeon used skin glue on the incision, you may shower in 24 hours.  The glue will flake off over the next  2-3 weeks.  Any sutures or staples will be removed at the office during your follow-up visit. ACTIVITIES:  You may resume regular (light) daily activities beginning the next day--such as daily self-care, walking, climbing stairs--gradually increasing activities as tolerated.  You may have sexual intercourse when it is comfortable.  Refrain from any heavy lifting or straining until approved by your doctor. You may drive when you are no longer taking prescription pain medication, you can comfortably wear a seatbelt, and you can safely maneuver your car and apply brakes. RETURN TO WORK:  __________________________________________________________ You should see your doctor in the office for a follow-up appointment approximately 2-3 weeks after your surgery.  Make sure that you call for this appointment within a day or two after you arrive home to insure a convenient appointment time. OTHER INSTRUCTIONS: __________________________________________________________________________________________________________________________ __________________________________________________________________________________________________________________________ WHEN TO CALL YOUR DOCTOR: Fever over 101.0 Inability to urinate Continued bleeding from incision. Increased pain, redness, or drainage from the incision. Increasing abdominal pain  The clinic staff is available to answer your questions during regular business hours.  Please don't hesitate to call and ask to speak to one of the nurses for clinical concerns.  If you have a medical emergency, go to the nearest emergency room or call 911.  A surgeon from [facility] is always on call at the hospital. [address], [address], [place], [place]  [address] ? [address] [address], [place], [place]   [address] [phone] ? [phone] ? FAX [phone] Web site: [email or web address]  

## 2021-11-04 NOTE — Progress Notes (Signed)
Report called to pre-op RN. Pt transported off unit via bed to OR. Family remains in room.

## 2021-11-04 NOTE — ED Provider Notes (Signed)
[facility] EMERGENCY DEPARTMENT Provider Note   CSN: [id number] Arrival date & time: 11/03/21  1927     History  Chief Complaint  Patient presents with   Abdominal Pain    Jacqueline Turner is a 41 y.o. female who was seen in transfer from [facility] this evening with concern for possible cholecystitis.  Patient initially had episodes of vomiting and diarrhea at home, NBNB emesis and epigastric pain that radiated to the back without any urinary symptoms.  She had laboratory studies that were reassuring without leukocytosis at [facility] and a CT of the abdomen and pelvis which revealed a distended gallbladder, cholelithiasis, and changes concerning for acute cholecystitis.  However radiologist recommended ultrasound of the right upper quadrant for further characterization of these changes.  Patient was transferred to [facility] this evening given lack ultrasonography overnight at [facility].  At this time patient is requesting pain medication following her transfer.  No vomiting since that time.  HPI     Home Medications Prior to Admission medications   Medication Sig Start Date End Date Taking? Authorizing Provider  Bacillus Coagulans-Inulin (PROBIOTIC) 1-250 BILLION-MG CAPS Probiotic    [provider]  cetirizine (ZYRTEC) 10 MG tablet Take by mouth.    [provider]  Multiple Vitamins-Minerals (MULTIVITAMIN ADULT EXTRA C PO) multivitamin    [provider]  Norethindrone Acetate-Ethinyl Estrad-FE (HAILEY 24 FE) 1-20 MG-MCG(24) tablet Hailey 24 Fe 1 mg-20 mcg (24)/75 mg (4) tablet  TAKE 1 TABLET BY MOUTH EVERY DAY    [provider]  pantoprazole (PROTONIX) 40 MG tablet Take 1 tablet (40 mg total) by mouth daily. 02/10/21 02/10/22  Loyola Mast, MD  RYBELSUS 14 MG TABS Take 1 tablet by mouth daily. 01/13/21   [provider]  VYVANSE 50 MG capsule Take 50 mg by mouth daily. 07/22/21   [provider]      Allergies    Sulfa antibiotics    Review of Systems   Review of Systems  Constitutional: Negative.   HENT: Negative.    Respiratory: Negative.    Cardiovascular: Negative.   Gastrointestinal:  Positive for abdominal distention and nausea. Negative for diarrhea and vomiting.  Genitourinary: Negative.   Neurological: Negative.     Physical Exam Updated Vital Signs BP (!) 143/96   Pulse 79   Temp 98.3 F (36.8 C) (Oral)   Resp 16   Ht 5\' 6"  (1.676 m)   Wt 77.8 kg   LMP 10/14/2021   SpO2 99%   BMI 27.68 kg/m  Physical Exam Vitals and nursing note reviewed.  Constitutional:      Appearance: She is not ill-appearing or toxic-appearing.  HENT:     Head: Normocephalic and atraumatic.     Mouth/Throat:     Mouth: Mucous membranes are moist.     Pharynx: No oropharyngeal exudate or posterior oropharyngeal erythema.  Eyes:     General:        Right eye: No discharge.        Left eye: No discharge.     Conjunctiva/sclera: Conjunctivae normal.  Cardiovascular:     Rate and Rhythm: Normal rate and regular rhythm.     Pulses: Normal pulses.  Pulmonary:     Effort: Pulmonary effort is normal. No respiratory distress.     Breath sounds: Normal breath sounds. No wheezing or rales.  Abdominal:     General: Bowel sounds are normal. There is no distension.  Palpations: Abdomen is soft.     Tenderness: There is abdominal tenderness in the right upper quadrant, epigastric area and periumbilical area.     Hernia: No hernia is present.  Musculoskeletal:        General: No deformity.     Cervical back: Neck supple.  Skin:    General: Skin is warm and dry.     Capillary Refill: Capillary refill takes less than 2 seconds.  Neurological:     Mental Status: She is alert. Mental status is at baseline.  Psychiatric:        Mood and Affect: Mood normal.     ED Results / Procedures / Treatments   Labs (all labs ordered are listed, but only abnormal results  are displayed) Labs Reviewed  COMPREHENSIVE METABOLIC PANEL - Abnormal; Notable for the following components:      Result Value   Glucose, Bld 104 (*)    Total Protein 8.2 (*)    Alkaline Phosphatase 35 (*)    All other components within normal limits  URINALYSIS, ROUTINE W REFLEX MICROSCOPIC - Abnormal; Notable for the following components:   APPearance TURBID (*)    Hgb urine dipstick TRACE (*)    Ketones, ur >=80 (*)    All other components within normal limits  URINALYSIS, MICROSCOPIC (REFLEX) - Abnormal; Notable for the following components:   Bacteria, UA MANY (*)    All other components within normal limits  URINE CULTURE  LIPASE, BLOOD  CBC  PREGNANCY, URINE    EKG None  Radiology US Abdomen Limited RUQ (LIVER/GB)  Result Date: 11/04/2021 CLINICAL DATA:  Right upper quadrant pain EXAM: ULTRASOUND ABDOMEN LIMITED RIGHT UPPER QUADRANT COMPARISON:  None Available. FINDINGS: Gallbladder: There are multiple gallstones. A negative sonographic Percell Miller sign was reported by the sonographer. Small amount of pericholecystic fluid. No wall thickening. Common bile duct: Diameter: 5 mm Liver: No focal lesion identified. Within normal limits in parenchymal echogenicity. Portal vein is patent on color Doppler imaging with normal direction of blood flow towards the liver. Other: None. IMPRESSION: Cholelithiasis without other evidence of acute cholecystitis. Electronically Signed   By: Ulyses Jarred M.D.   On: 11/04/2021 02:22   CT ABDOMEN PELVIS W CONTRAST  Result Date: 11/03/2021 CLINICAL DATA:  Flank pain, nausea, vomiting, diarrhea. EXAM: CT ABDOMEN AND PELVIS WITH CONTRAST TECHNIQUE: Multidetector CT imaging of the abdomen and pelvis was performed using the standard protocol following bolus administration of intravenous contrast. RADIATION DOSE REDUCTION: This exam was performed according to the departmental dose-optimization program which includes automated exposure control, adjustment of  the mA and/or kV according to patient size and/or use of iterative reconstruction technique. CONTRAST:  142mL OMNIPAQUE IOHEXOL 300 MG/ML  SOLN COMPARISON:  None Available. FINDINGS: Lower chest: No acute abnormality. Hepatobiliary: Several small calcified gallstones are seen dependently within the gallbladder fundus. The gallbladder is distended and there is trace pericholecystic fluid noted surrounding the gallbladder neck. Together, the findings may reflect changes of acute cholecystitis. The liver is unremarkable. No intra or extrahepatic biliary ductal dilation. Pancreas: Unremarkable Spleen: Unremarkable Adrenals/Urinary Tract: Adrenal glands are unremarkable. Kidneys are normal, without renal calculi, focal lesion, or hydronephrosis. Bladder is unremarkable. Stomach/Bowel: Stomach is within normal limits. Appendix appears normal. No evidence of bowel wall thickening, distention, or inflammatory changes. No free intraperitoneal gas or fluid. Vascular/Lymphatic: No significant vascular findings are present. No enlarged abdominal or pelvic lymph nodes. Reproductive: Heterogeneous enhancement of the uterus may relate to underlying uterine fibroids. The pelvic organs  are otherwise unremarkable. Other: No abdominal wall hernia. Musculoskeletal: No acute or significant osseous findings. IMPRESSION: 1. Cholelithiasis. Distended gallbladder with trace pericholecystic fluid surrounding the gallbladder neck. Together, the findings may reflect changes of acute cholecystitis. Correlation with liver enzymes is recommended. Dedicated gallbladder sonography may be helpful for further evaluation. Electronically Signed   By: Helyn Numbers M.D.   On: 11/03/2021 23:05    Procedures Procedures    Medications Ordered in ED Medications  ondansetron (ZOFRAN-ODT) disintegrating tablet 4 mg (4 mg Oral Given 11/03/21 2000)  morphine (PF) 4 MG/ML injection 4 mg (4 mg Intravenous Given 11/03/21 2257)  lactated ringers bolus 1,000  mL (0 mLs Intravenous Stopped 11/03/21 2359)  iohexol (OMNIPAQUE) 300 MG/ML solution 100 mL (100 mLs Intravenous Contrast Given 11/03/21 2245)  ondansetron (ZOFRAN) injection 4 mg (4 mg Intravenous Given 11/03/21 2254)  morphine (PF) 4 MG/ML injection 4 mg (4 mg Intravenous Given 11/04/21 0215)    ED Course/ Medical Decision Making/ A&P Clinical Course as of 11/04/21 0932  Sat Nov 04, 2021  0249 Consult to general surgeon Dr. Cliffton Asters who states that this patient is afebrile, does not have a white count, and is feeling improved without acute abdominal exam at this time, it is reasonable to proceed with p.o. challenge.  If patient passes p.o. challenge may follow-up in their office this week for scheduling of surgery.  If she fails, we will plan to treat her empirically with antibiotics and bring her to the hospital for emergent surgical planning.  I appreciate his collaboration in care of this patient. [RS]  0630 Patient reevaluated after p.o. challenge, unable to complete even 1 small cup of water due to severe worsening of her pain with drinking.  Unfortunately do not feel she is good candidate for disposition home.  Reconsult to Dr. Cliffton Asters, general surgeon who will evaluate the patient at the bedside and plan for surgical intervention.  Patient to be kept n.p.o.  I appreciate his collaboration in care of this patient. [RS]    Clinical Course User Index [RS] Sweta Halseth, Eugene Gavia, PA-C                           Medical Decision Making 41 year old female with epigastric and right upper quadrant pain with associated nausea and vomiting and diarrhea who presents for completion of cholecystitis work-up.  Mildly hypertensive on intake, vital signs otherwise normal.  Abdominal exam with epigastric and right upper quadrant tenderness palpation with positive Murphy sign, otherwise benign.  Cardiopulmonary exam is normal, patient is well-appearing at this time following pain medication.  Amount and/or Complexity  of Data Reviewed Labs: ordered.    Details: CBC without leukocytosis or anemia, CMP without changes in liver function testing.  Lipase is normal.  Urine without evidence of infection. Radiology: ordered.    Details: CT as above with changes concerning for possible acute cholecystitis.  Right upper quadrant ultrasound however revealed cholelithiasis without evidence of acute cholecystitis.  Risk Prescription drug management. Decision regarding hospitalization.   Consult to Dr. Cliffton Asters, general surgeon as above.  Plan was initially to p.o. challenge the patient, however she failed this and he was reconsulted with plan to see the patient in the emergency department. She will be admitted to his service and will likely proceed with surgical intervention on the short interim.  I appreciate his collaboration in care of this patient.  Mercedees Convery  voiced understanding of her medical evaluation and treatment plan.  Each of their questions answered to their expressed satisfaction.  She is amenable to plan for admission at this time.  Morning ED team made aware of this patient's disposition plan.   This chart was dictated using voice recognition software, Dragon. Despite the best efforts of this provider to proofread and correct errors, errors may still occur which can change documentation meaning.   Final Clinical Impression(s) / ED Diagnoses Final diagnoses:  Epigastric pain  Bacteriuria    Rx / DC Orders ED Discharge Orders     None         Aura Dials 11/04/21 [id number]    Fatima Blank, MD 11/04/21 [phone]

## 2021-11-04 NOTE — Anesthesia Procedure Notes (Signed)
Procedure Name: Intubation Date/Time: 11/04/2021 2:04 PM  Performed by: Inda Coke, CRNAPre-anesthesia Checklist: Patient identified, Emergency Drugs available, Suction available, Timeout performed and Patient being monitored Patient Re-evaluated:Patient Re-evaluated prior to induction Oxygen Delivery Method: Circle system utilized Preoxygenation: Pre-oxygenation with 100% oxygen Induction Type: IV induction Ventilation: Mask ventilation without difficulty Laryngoscope Size: Mac and 3 Grade View: Grade I Tube type: Oral Tube size: 7.0 mm Airway Equipment and Method: Stylet Placement Confirmation: ETT inserted through vocal cords under direct vision, positive ETCO2, CO2 detector and breath sounds checked- equal and bilateral Secured at: 22 cm Tube secured with: Tape Dental Injury: Teeth and Oropharynx as per pre-operative assessment

## 2021-11-04 NOTE — ED Notes (Signed)
Pt here from [facility] via [facility] for an [place] d/t an enlarged gallbladder. 20 R AC

## 2021-11-04 NOTE — Anesthesia Postprocedure Evaluation (Signed)
Anesthesia Post Note  Patient: Jacqueline Turner  Procedure(s) Performed: LAPAROSCOPIC CHOLECYSTECTOMY (Abdomen) INDOCYANINE GREEN FLUORESCENCE IMAGING (ICG) (Abdomen)     Patient location during evaluation: PACU Anesthesia Type: General Level of consciousness: awake and alert Pain management: pain level controlled Vital Signs Assessment: post-procedure vital signs reviewed and stable Respiratory status: spontaneous breathing, nonlabored ventilation, respiratory function stable and patient connected to nasal cannula oxygen Cardiovascular status: blood pressure returned to baseline and stable Postop Assessment: no apparent nausea or vomiting Anesthetic complications: no   No notable events documented.  Last Vitals:  Vitals:   11/04/21 1550 11/04/21 1610  BP: (!) 151/91 (!) 163/94  Pulse: 80 72  Resp: 14 17  Temp: 36.4 C 36.6 C  SpO2: 94% 100%                Shelton Silvas

## 2021-11-04 NOTE — Transfer of Care (Signed)
Immediate Anesthesia Transfer of Care Note  Patient: Jacqueline Turner  Procedure(s) Performed: LAPAROSCOPIC CHOLECYSTECTOMY (Abdomen) INDOCYANINE GREEN FLUORESCENCE IMAGING (ICG) (Abdomen)  Patient Location: PACU  Anesthesia Type:General  Level of Consciousness: drowsy  Airway & Oxygen Therapy: Patient Spontanous Breathing  Post-op Assessment: Report given to RN and Post -op Vital signs reviewed and stable  Post vital signs: Reviewed and stable  Last Vitals:  Vitals Value Taken Time  BP 149/89 11/04/21 1526  Temp    Pulse 92 11/04/21 1527  Resp 16 11/04/21 1527  SpO2 100 % 11/04/21 1527  Vitals shown include unvalidated device data.  Last Pain:  Vitals:   11/04/21 1023  TempSrc: Oral  PainSc:          Complications: No notable events documented.

## 2021-11-04 NOTE — ED Notes (Signed)
ED TO INPATIENT HANDOFF REPORT  ED Nurse Name and Phone #: [phone]  S Name/Age/Gender Jacqueline Turner 41 y.o. female Room/Bed: 001C/001C  Code Status   Code Status: Full Code  Home/SNF/Other Home Patient oriented to: self, place, time, and situation Is this baseline? Yes   Triage Complete: Triage complete  Chief Complaint Acute cholecystitis [K81.0]  Triage Note Pt states epigastric pain radiating to back that started this morning  Reports N/V/D Denies fever  Went to UC and sent to ER for further eval    Allergies Allergies  Allergen Reactions   Sulfa Antibiotics Other (See Comments)    Unknown reaction     Level of Care/Admitting Diagnosis ED Disposition     ED Disposition  Admit   Condition  --   Comment  Hospital Area: [facility] [[id number]]  Level of Care: Med-Surg [16]  May place patient in observation at [facility] or [facility] if equivalent level of care is available:: No  Covid Evaluation: Asymptomatic - no recent exposure (last 10 days) testing not required  Diagnosis: Acute cholecystitis [575.0.ICD-9-CM]  Admitting Physician: Andria Meuse [[id number]]  Attending Physician: Andria Meuse [[id number]]          B Medical/Surgery History Past Medical History:  Diagnosis Date   Allergy    Anxiety    GERD (gastroesophageal reflux disease)    Migraines    History reviewed. No pertinent surgical history.   A IV Location/Drains/Wounds Patient Lines/Drains/Airways Status     Active Line/Drains/Airways     Name Placement date Placement time Site Days   Peripheral IV 11/03/21 20 G 1" Right;Medial;Anterior Antecubital 11/03/21  2113  Antecubital  1            Intake/Output Last 24 hours  Intake/Output Summary (Last 24 hours) at 11/04/2021 3976 Last data filed at 11/03/2021 2250 Gross per 24 hour  Intake --  Output 200 ml  Net -200 ml    Labs/Imaging Results for orders placed or performed during the  hospital encounter of 11/03/21 (from the past 48 hour(s))  Lipase, blood     Status: None   Collection Time: 11/03/21  9:10 PM  Result Value Ref Range   Lipase 22 11 - 51 U/L    Comment: Performed at [facility], [address] [place], [place] [address]  Comprehensive metabolic panel     Status: Abnormal   Collection Time: 11/03/21  9:10 PM  Result Value Ref Range   Sodium 139 135 - 145 mmol/L   Potassium 4.0 3.5 - 5.1 mmol/L   Chloride 108 98 - 111 mmol/L   CO2 24 22 - 32 mmol/L   Glucose, Bld 104 (H) 70 - 99 mg/dL    Comment: Glucose reference range applies only to samples taken after fasting for at least 8 hours.   BUN 11 6 - 20 mg/dL   Creatinine, Ser 3.79 0.44 - 1.00 mg/dL   Calcium 9.4 8.9 - 02.4 mg/dL   Total Protein 8.2 (H) 6.5 - 8.1 g/dL   Albumin 4.2 3.5 - 5.0 g/dL   AST 16 15 - 41 U/L   ALT 15 0 - 44 U/L   Alkaline Phosphatase 35 (L) 38 - 126 U/L   Total Bilirubin 0.8 0.3 - 1.2 mg/dL   GFR, Estimated >09 >73 mL/min    Comment: (NOTE) Calculated using the CKD-EPI Creatinine Equation (2021)    Anion gap 7 5 - 15    Comment: Performed  at [facility], [address] [place], [place] [address]  CBC     Status: None   Collection Time: 11/03/21  9:10 PM  Result Value Ref Range   WBC 10.0 4.0 - 10.5 K/uL   RBC 4.39 3.87 - 5.11 MIL/uL   Hemoglobin 13.9 12.0 - 15.0 g/dL   HCT 38.2 50.5 - 39.7 %   MCV 91.8 80.0 - 100.0 fL   MCH 31.7 26.0 - 34.0 pg   MCHC 34.5 30.0 - 36.0 g/dL   RDW 67.3 41.9 - 37.9 %   Platelets 324 150 - 400 K/uL   nRBC 0.0 0.0 - 0.2 %    Comment: Performed at [facility], [address] [place], [place] [address]  Urinalysis, Routine w reflex microscopic Urine, Clean Catch     Status: Abnormal   Collection Time: 11/03/21  9:23 PM  Result Value Ref Range   Color, Urine YELLOW YELLOW   APPearance TURBID (A) CLEAR   Specific Gravity, Urine 1.025 1.005 - 1.030   pH 7.0 5.0 - 8.0   Glucose, UA NEGATIVE NEGATIVE  mg/dL   Hgb urine dipstick TRACE (A) NEGATIVE   Bilirubin Urine NEGATIVE NEGATIVE   Ketones, ur >=80 (A) NEGATIVE mg/dL   Protein, ur NEGATIVE NEGATIVE mg/dL   Nitrite NEGATIVE NEGATIVE   Leukocytes,Ua NEGATIVE NEGATIVE    Comment: Performed at [facility], [address] [place], [place] [address]  Pregnancy, urine     Status: None   Collection Time: 11/03/21  9:23 PM  Result Value Ref Range   Preg Test, Ur NEGATIVE NEGATIVE    Comment:        THE SENSITIVITY OF THIS METHODOLOGY IS >20 mIU/mL. Performed at [facility], [address] [place], [place] [address]   Urinalysis, Microscopic (reflex)     Status: Abnormal   Collection Time: 11/03/21  9:23 PM  Result Value Ref Range   RBC / HPF 0-5 0 - 5 RBC/hpf   WBC, UA 0-5 0 - 5 WBC/hpf   Bacteria, UA MANY (A) NONE SEEN   Squamous Epithelial / LPF 0-5 0 - 5   Mucus PRESENT    Amorphous Crystal PRESENT     Comment: Performed at [facility], [address] [place], [place] [address]   US Abdomen Limited RUQ (LIVER/GB)  Result Date: 11/04/2021 CLINICAL DATA:  Right upper quadrant pain EXAM: ULTRASOUND ABDOMEN LIMITED RIGHT UPPER QUADRANT COMPARISON:  None Available. FINDINGS: Gallbladder: There are multiple gallstones. A negative sonographic Eulah Pont sign was reported by the sonographer. Small amount of pericholecystic fluid. No wall thickening. Common bile duct: Diameter: 5 mm Liver: No focal lesion identified. Within normal limits in parenchymal echogenicity. Portal vein is patent on color Doppler imaging with normal direction of blood flow towards the liver. Other: None. IMPRESSION: Cholelithiasis without other evidence of acute cholecystitis. Electronically Signed   By: Deatra Robinson M.D.   On: 11/04/2021 02:22   CT ABDOMEN PELVIS W CONTRAST  Result Date: 11/03/2021 CLINICAL DATA:  Flank pain, nausea, vomiting, diarrhea. EXAM: CT ABDOMEN AND PELVIS WITH CONTRAST TECHNIQUE: Multidetector CT imaging of  the abdomen and pelvis was performed using the standard protocol following bolus administration of intravenous contrast. RADIATION DOSE REDUCTION: This exam was performed according to the departmental dose-optimization program which includes automated exposure control, adjustment of the mA and/or kV according to patient size and/or use of iterative reconstruction technique. CONTRAST:  OMNIPAQUE IOHEXOL 300 MG/ML  SOLN COMPARISON:  None Available. FINDINGS: Lower chest: No acute abnormality. Hepatobiliary: Several small calcified gallstones are seen dependently within the gallbladder fundus. The gallbladder is distended and there is trace pericholecystic fluid noted surrounding the gallbladder neck. Together, the findings may reflect changes of acute cholecystitis. The liver is unremarkable. No intra or extrahepatic biliary ductal dilation. Pancreas: Unremarkable Spleen: Unremarkable Adrenals/Urinary Tract: Adrenal glands are unremarkable. Kidneys are normal, without renal calculi, focal lesion, or hydronephrosis. Bladder is unremarkable. Stomach/Bowel: Stomach is within normal limits. Appendix appears normal. No evidence of bowel wall thickening, distention, or inflammatory changes. No free intraperitoneal gas or fluid. Vascular/Lymphatic: No significant vascular findings are present. No enlarged abdominal or pelvic lymph nodes. Reproductive: Heterogeneous enhancement of the uterus may relate to underlying uterine fibroids. The pelvic organs are otherwise unremarkable. Other: No abdominal wall hernia. Musculoskeletal: No acute or significant osseous findings. IMPRESSION: 1. Cholelithiasis. Distended gallbladder with trace pericholecystic fluid surrounding the gallbladder neck. Together, the findings may reflect changes of acute cholecystitis. Correlation with liver enzymes is recommended. Dedicated gallbladder sonography may be helpful for further evaluation. Electronically Signed   By: Helyn Numbers M.D.    On: 11/03/2021 23:05    Pending Labs Unresulted Labs (From admission, onward)     Start     Ordered   11/04/21 0744  HIV Antibody (routine testing w rflx)  (HIV Antibody (Routine testing w reflex) panel)  Once,   R        11/04/21 0743   11/03/21 2232  Urine Culture  Once,   URGENT       Question:  Indication  Answer:  Dysuria   11/03/21 2232            Vitals/Pain Today's Vitals   11/04/21 0700 11/04/21 0701 11/04/21 0714 11/04/21 0715  BP:      Pulse:    68  Resp:      Temp: 98.3 F (36.8 C)     TempSrc: Oral     SpO2:    99%  Weight:      Height:      PainSc:  7  8      Isolation Precautions No active isolations  Medications Medications  Semaglutide TABS 14 mg (has no administration in time range)  pantoprazole (PROTONIX) EC tablet 40 mg (has no administration in time range)  heparin injection 5,000 Units (has no administration in time range)  cefTRIAXone (ROCEPHIN) 2 g in sodium chloride 0.9 % 100 mL IVPB (has no administration in time range)  lactated ringers infusion (has no administration in time range)  diphenhydrAMINE (BENADRYL) 12.5 MG/5ML elixir 12.5 mg (has no administration in time range)    Or  diphenhydrAMINE (BENADRYL) injection 12.5 mg (has no administration in time range)  acetaminophen (TYLENOL) tablet 1,000 mg (has no administration in time range)  oxyCODONE (Oxy IR/ROXICODONE) immediate release tablet 5-10 mg (has no administration in time range)  HYDROmorphone (DILAUDID) injection 0.5 mg (0.5 mg Intravenous Given 11/04/21 0812)  ondansetron (ZOFRAN-ODT) disintegrating tablet 4 mg (4 mg Oral Given 11/03/21 2000)  morphine (PF) 4 MG/ML injection 4 mg (4 mg Intravenous Given 11/03/21 2257)  lactated ringers bolus 1,000 mL (0 mLs Intravenous Stopped 11/03/21 2359)  iohexol (OMNIPAQUE) 300 MG/ML solution 100 mL (100 mLs Intravenous Contrast Given 11/03/21 2245)  ondansetron (ZOFRAN) injection 4 mg (4 mg Intravenous Given 11/03/21 2254)  morphine (PF) 4 MG/ML  injection 4 mg (4 mg Intravenous Given 11/04/21 0215)  Mobility walks Low fall risk   Focused Assessments GI- 4/10 pain. Surgery scheduled for today. NPO   R Recommendations: See Admitting Provider Note  Report given to:   Additional Notes:    Ambulatory, room air, AOx4

## 2021-11-04 NOTE — ED Notes (Signed)
US at bedside

## 2021-11-04 NOTE — Progress Notes (Signed)
Pt awake and alert; up in chair. Ambulated to BR with standby assist. Pt able to void at this time and reports passing gas. Clear liquids ordered for meal. Medicated pt per [facility] for 7/10 abdominal pain.

## 2021-11-04 NOTE — H&P (Addendum)
CC: RUQ pain  HPI: Jacqueline Turner is an 41 y.o. female with hx of GERD/reflux who presented to [facility] emergency room with right upper quadrant pain.  This began 8/4.  It radiates to her back.  She has had associated nausea vomiting and some loose stool.  The nausea/vomiting/diarrhea has since resolved.  She still has persistent right upper quadrant pain.  She underwent work-up in the ER and was found to have equivocal findings on a CAT scan for possible cholecystitis.  She was therefore transferred to [facility] for a right upper quadrant ultrasound.  After completion of these tests, she had some improvement in her symptoms but not complete.  She was given a few sips of water but her pain persisted.  We were asked to see.  Past Medical History:  Diagnosis Date   Allergy    Anxiety    GERD (gastroesophageal reflux disease)    Migraines     History reviewed. No pertinent surgical history.  Family History  Problem Relation Age of Onset   Cancer Mother        breast cancer   Diabetes Father    Heart disease Father    Hypertension Father    Hyperlipidemia Father    Heart disease Paternal Uncle    Heart disease Paternal Uncle    Heart disease Paternal Uncle    Cancer Paternal Uncle        Pancreatic   Cancer Maternal Grandmother        Skin, cervical   Stroke Maternal Grandmother    Dementia Maternal Grandmother    Cancer Paternal Grandfather        Leukemia    Social:  reports that she has never smoked. She has never used smokeless tobacco. She reports current alcohol use. She reports that she does not use drugs.  Allergies:  Allergies  Allergen Reactions   Sulfa Antibiotics     Other reaction(s): Unknown    Medications: I have reviewed the patient's current medications.  Results for orders placed or performed during the hospital encounter of 11/03/21 (from the past 48 hour(s))  Lipase, blood     Status: None   Collection Time: 11/03/21  9:10 PM   Result Value Ref Range   Lipase 22 11 - 51 U/L    Comment: Performed at [facility], [address] [place], [place] [address]  Comprehensive metabolic panel     Status: Abnormal   Collection Time: 11/03/21  9:10 PM  Result Value Ref Range   Sodium 139 135 - 145 mmol/L   Potassium 4.0 3.5 - 5.1 mmol/L   Chloride 108 98 - 111 mmol/L   CO2 24 22 - 32 mmol/L   Glucose, Bld 104 (H) 70 - 99 mg/dL    Comment: Glucose reference range applies only to samples taken after fasting for at least 8 hours.   BUN 11 6 - 20 mg/dL   Creatinine, Ser 7.35 0.44 - 1.00 mg/dL   Calcium 9.4 8.9 - 32.9 mg/dL   Total Protein 8.2 (H) 6.5 - 8.1 g/dL   Albumin 4.2 3.5 - 5.0 g/dL   AST 16 15 - 41 U/L   ALT 15 0 - 44 U/L   Alkaline Phosphatase 35 (L) 38 - 126 U/L   Total Bilirubin 0.8 0.3 - 1.2 mg/dL   GFR, Estimated >92 >42 mL/min    Comment: (NOTE) Calculated using the CKD-EPI Creatinine Equation (2021)    Anion  gap 7 5 - 15    Comment: Performed at [facility] [address] [place], [place] [address]  CBC     Status: None   Collection Time: 11/03/21  9:10 PM  Result Value Ref Range   WBC 10.0 4.0 - 10.5 K/uL   RBC 4.39 3.87 - 5.11 MIL/uL   Hemoglobin 13.9 12.0 - 15.0 g/dL   HCT 15.4 00.8 - 67.6 %   MCV 91.8 80.0 - 100.0 fL   MCH 31.7 26.0 - 34.0 pg   MCHC 34.5 30.0 - 36.0 g/dL   RDW 19.5 09.3 - 26.7 %   Platelets 324 150 - 400 K/uL   nRBC 0.0 0.0 - 0.2 %    Comment: Performed at [facility], [address] [place], [place] [address]  Urinalysis, Routine w reflex microscopic Urine, Clean Catch     Status: Abnormal   Collection Time: 11/03/21  9:23 PM  Result Value Ref Range   Color, Urine YELLOW YELLOW   APPearance TURBID (A) CLEAR   Specific Gravity, Urine 1.025 1.005 - 1.030   pH 7.0 5.0 - 8.0   Glucose, UA NEGATIVE NEGATIVE mg/dL   Hgb urine dipstick TRACE (A) NEGATIVE   Bilirubin Urine NEGATIVE NEGATIVE   Ketones, ur >=80 (A) NEGATIVE mg/dL    Protein, ur NEGATIVE NEGATIVE mg/dL   Nitrite NEGATIVE NEGATIVE   Leukocytes,Ua NEGATIVE NEGATIVE    Comment: Performed at [facility], [address] [place], [place] [address]  Pregnancy, urine     Status: None   Collection Time: 11/03/21  9:23 PM  Result Value Ref Range   Preg Test, Ur NEGATIVE NEGATIVE    Comment:        THE SENSITIVITY OF THIS METHODOLOGY IS >20 mIU/mL. Performed at [facility], [address] [place], [place] [address]   Urinalysis, Microscopic (reflex)     Status: Abnormal   Collection Time: 11/03/21  9:23 PM  Result Value Ref Range   RBC / HPF 0-5 0 - 5 RBC/hpf   WBC, UA 0-5 0 - 5 WBC/hpf   Bacteria, UA MANY (A) NONE SEEN   Squamous Epithelial / LPF 0-5 0 - 5   Mucus PRESENT    Amorphous Crystal PRESENT     Comment: Performed at [facility], [address] [place], [place] [address]    US Abdomen Limited RUQ (LIVER/GB)  Result Date: 11/04/2021 CLINICAL DATA:  Right upper quadrant pain EXAM: ULTRASOUND ABDOMEN LIMITED RIGHT UPPER QUADRANT COMPARISON:  None Available. FINDINGS: Gallbladder: There are multiple gallstones. A negative sonographic Eulah Pont sign was reported by the sonographer. Small amount of pericholecystic fluid. No wall thickening. Common bile duct: Diameter: 5 mm Liver: No focal lesion identified. Within normal limits in parenchymal echogenicity. Portal vein is patent on color Doppler imaging with normal direction of blood flow towards the liver. Other: None. IMPRESSION: Cholelithiasis without other evidence of acute cholecystitis. Electronically Signed   By: Deatra Robinson M.D.   On: 11/04/2021 02:22   CT ABDOMEN PELVIS W CONTRAST  Result Date: 11/03/2021 CLINICAL DATA:  Flank pain, nausea, vomiting, diarrhea. EXAM: CT ABDOMEN AND PELVIS WITH CONTRAST TECHNIQUE: Multidetector CT imaging of the abdomen and pelvis was performed using the standard protocol following bolus administration of intravenous contrast.  RADIATION DOSE REDUCTION: This exam was performed according to the departmental dose-optimization program which includes automated exposure control, adjustment of the mA and/or kV according  to patient size and/or use of iterative reconstruction technique. CONTRAST:  100mL OMNIPAQUE IOHEXOL 300 MG/ML  SOLN COMPARISON:  None Available. FINDINGS: Lower chest: No acute abnormality. Hepatobiliary: Several small calcified gallstones are seen dependently within the gallbladder fundus. The gallbladder is distended and there is trace pericholecystic fluid noted surrounding the gallbladder neck. Together, the findings may reflect changes of acute cholecystitis. The liver is unremarkable. No intra or extrahepatic biliary ductal dilation. Pancreas: Unremarkable Spleen: Unremarkable Adrenals/Urinary Tract: Adrenal glands are unremarkable. Kidneys are normal, without renal calculi, focal lesion, or hydronephrosis. Bladder is unremarkable. Stomach/Bowel: Stomach is within normal limits. Appendix appears normal. No evidence of bowel wall thickening, distention, or inflammatory changes. No free intraperitoneal gas or fluid. Vascular/Lymphatic: No significant vascular findings are present. No enlarged abdominal or pelvic lymph nodes. Reproductive: Heterogeneous enhancement of the uterus may relate to underlying uterine fibroids. The pelvic organs are otherwise unremarkable. Other: No abdominal wall hernia. Musculoskeletal: No acute or significant osseous findings. IMPRESSION: 1. Cholelithiasis. Distended gallbladder with trace pericholecystic fluid surrounding the gallbladder neck. Together, the findings may reflect changes of acute cholecystitis. Correlation with liver enzymes is recommended. Dedicated gallbladder sonography may be helpful for further evaluation. Electronically Signed   By: Helyn NumbersAshesh  Parikh M.D.   On: 11/03/2021 23:05    ROS - all of the below systems have been reviewed with the patient and positives are indicated  with bold text General: chills, fever or night sweats Eyes: blurry vision or double vision ENT: epistaxis or sore throat Allergy/Immunology: itchy/watery eyes or nasal congestion Hematologic/Lymphatic: bleeding problems, blood clots or swollen lymph nodes Endocrine: temperature intolerance or unexpected weight changes Breast: new or changing breast lumps or nipple discharge Resp: cough, shortness of breath, or wheezing CV: chest pain or dyspnea on exertion GI: as per HPI GU: dysuria, trouble voiding, or hematuria MSK: joint pain or joint stiffness Neuro: TIA or stroke symptoms Derm: pruritus and skin lesion changes Psych: anxiety and depression  PE Blood pressure (!) 143/96, pulse 79, temperature 98.5 F (36.9 C), temperature source Oral, resp. rate 16, height 5\' 6"  (1.676 m), weight 77.8 kg, last menstrual period 10/14/2021, SpO2 99 %. Constitutional: NAD; conversant Eyes: Moist conjunctiva; no lid lag; anicteric Neck: Trachea midline; no thyromegaly Lungs: Normal respiratory effort; no tactile fremitus CV: RRR; no palpable thrills; no pitting edema GI: Abd soft, mildly ttp in RUQ; no rebound nor guarding. No tenderness elsewhere. Negative Murphy's.  No palpable hepatosplenomegaly MSK: Normal range of motion of extremities; no clubbing/cyanosis Psychiatric: Appropriate affect; alert and oriented x3 Lymphatic: No palpable cervical or axillary lymphadenopathy  Results for orders placed or performed during the hospital encounter of 11/03/21 (from the past 48 hour(s))  Lipase, blood     Status: None   Collection Time: 11/03/21  9:10 PM  Result Value Ref Range   Lipase 22 11 - 51 U/L    Comment: Performed at [facility], [address] [place], [place] [id number]  Comprehensive metabolic panel     Status: Abnormal   Collection Time: 11/03/21  9:10 PM  Result Value Ref Range   Sodium 139 135 - 145 mmol/L   Potassium 4.0 3.5 - 5.1 mmol/L   Chloride 108 98 - 111 mmol/L    CO2 24 22 - 32 mmol/L   Glucose, Bld 104 (H) 70 - 99 mg/dL    Comment: Glucose reference range applies only to samples taken after fasting for at least 8 hours.   BUN 11 6 - 20  mg/dL   Creatinine, Ser 9.73 0.44 - 1.00 mg/dL   Calcium 9.4 8.9 - 53.2 mg/dL   Total Protein 8.2 (H) 6.5 - 8.1 g/dL   Albumin 4.2 3.5 - 5.0 g/dL   AST 16 15 - 41 U/L   ALT 15 0 - 44 U/L   Alkaline Phosphatase 35 (L) 38 - 126 U/L   Total Bilirubin 0.8 0.3 - 1.2 mg/dL   GFR, Estimated >99 >24 mL/min    Comment: (NOTE) Calculated using the CKD-EPI Creatinine Equation (2021)    Anion gap 7 5 - 15    Comment: Performed at [facility], [address] [place], [place] [address]  CBC     Status: None   Collection Time: 11/03/21  9:10 PM  Result Value Ref Range   WBC 10.0 4.0 - 10.5 K/uL   RBC 4.39 3.87 - 5.11 MIL/uL   Hemoglobin 13.9 12.0 - 15.0 g/dL   HCT 19.6 22.2 - 97.9 %   MCV 91.8 80.0 - 100.0 fL   MCH 31.7 26.0 - 34.0 pg   MCHC 34.5 30.0 - 36.0 g/dL   RDW 89.2 11.9 - 41.7 %   Platelets 324 150 - 400 K/uL   nRBC 0.0 0.0 - 0.2 %    Comment: Performed at [facility], [address] [place], [place] [address]  Urinalysis, Routine w reflex microscopic Urine, Clean Catch     Status: Abnormal   Collection Time: 11/03/21  9:23 PM  Result Value Ref Range   Color, Urine YELLOW YELLOW   APPearance TURBID (A) CLEAR   Specific Gravity, Urine 1.025 1.005 - 1.030   pH 7.0 5.0 - 8.0   Glucose, UA NEGATIVE NEGATIVE mg/dL   Hgb urine dipstick TRACE (A) NEGATIVE   Bilirubin Urine NEGATIVE NEGATIVE   Ketones, ur >=80 (A) NEGATIVE mg/dL   Protein, ur NEGATIVE NEGATIVE mg/dL   Nitrite NEGATIVE NEGATIVE   Leukocytes,Ua NEGATIVE NEGATIVE    Comment: Performed at [facility], [address] [place], [place] [address]  Pregnancy, urine     Status: None   Collection Time: 11/03/21  9:23 PM  Result Value Ref Range   Preg Test, Ur NEGATIVE NEGATIVE    Comment:        THE  SENSITIVITY OF THIS METHODOLOGY IS >20 mIU/mL. Performed at [facility], [address] [place], [place] [address]   Urinalysis, Microscopic (reflex)     Status: Abnormal   Collection Time: 11/03/21  9:23 PM  Result Value Ref Range   RBC / HPF 0-5 0 - 5 RBC/hpf   WBC, UA 0-5 0 - 5 WBC/hpf   Bacteria, UA MANY (A) NONE SEEN   Squamous Epithelial / LPF 0-5 0 - 5   Mucus PRESENT    Amorphous Crystal PRESENT     Comment: Performed at [facility], [address] [place], [place] [address]    US Abdomen Limited RUQ (LIVER/GB)  Result Date: 11/04/2021 CLINICAL DATA:  Right upper quadrant pain EXAM: ULTRASOUND ABDOMEN LIMITED RIGHT UPPER QUADRANT COMPARISON:  None Available. FINDINGS: Gallbladder: There are multiple gallstones. A negative sonographic Eulah Pont sign was reported by the sonographer. Small amount of pericholecystic fluid. No wall thickening. Common bile duct: Diameter: 5 mm Liver: No focal lesion identified. Within normal limits in parenchymal echogenicity. Portal vein is patent on color Doppler imaging with normal direction of blood flow towards the liver. Other: None. IMPRESSION:  Cholelithiasis without other evidence of acute cholecystitis. Electronically Signed   By: Deatra Robinson M.D.   On: 11/04/2021 02:22   CT ABDOMEN PELVIS W CONTRAST  Result Date: 11/03/2021 CLINICAL DATA:  Flank pain, nausea, vomiting, diarrhea. EXAM: CT ABDOMEN AND PELVIS WITH CONTRAST TECHNIQUE: Multidetector CT imaging of the abdomen and pelvis was performed using the standard protocol following bolus administration of intravenous contrast. RADIATION DOSE REDUCTION: This exam was performed according to the departmental dose-optimization program which includes automated exposure control, adjustment of the mA and/or kV according to patient size and/or use of iterative reconstruction technique. CONTRAST:  OMNIPAQUE IOHEXOL 300 MG/ML  SOLN COMPARISON:  None Available. FINDINGS: Lower  chest: No acute abnormality. Hepatobiliary: Several small calcified gallstones are seen dependently within the gallbladder fundus. The gallbladder is distended and there is trace pericholecystic fluid noted surrounding the gallbladder neck. Together, the findings may reflect changes of acute cholecystitis. The liver is unremarkable. No intra or extrahepatic biliary ductal dilation. Pancreas: Unremarkable Spleen: Unremarkable Adrenals/Urinary Tract: Adrenal glands are unremarkable. Kidneys are normal, without renal calculi, focal lesion, or hydronephrosis. Bladder is unremarkable. Stomach/Bowel: Stomach is within normal limits. Appendix appears normal. No evidence of bowel wall thickening, distention, or inflammatory changes. No free intraperitoneal gas or fluid. Vascular/Lymphatic: No significant vascular findings are present. No enlarged abdominal or pelvic lymph nodes. Reproductive: Heterogeneous enhancement of the uterus may relate to underlying uterine fibroids. The pelvic organs are otherwise unremarkable. Other: No abdominal wall hernia. Musculoskeletal: No acute or significant osseous findings. IMPRESSION: 1. Cholelithiasis. Distended gallbladder with trace pericholecystic fluid surrounding the gallbladder neck. Together, the findings may reflect changes of acute cholecystitis. Correlation with liver enzymes is recommended. Dedicated gallbladder sonography may be helpful for further evaluation. Electronically Signed   By: Helyn Numbers M.D.   On: 11/03/2021 23:05    I have personally reviewed the relevant CT A/P 8/4, RUQ [place] 8/5; CBC, CMP, Lipase 8/5  A/P: Jacqueline Turner is an 41 y.o. female with acute cholecystitis -The anatomy and physiology of the hepatobiliary system was discussed at length with the patient with associated pictures. The pathophysiology of gallbladder disease was discussed at length with associated pictures. -The options for treatment were discussed including ongoing observation  which may result in subsequent gallbladder complications (infection, pancreatitis, choledocholithiasis, etc), percutaneous drain and surgery - laparoscopic cholecystectomy with indocyanine green cholangiography. -The planned procedure, material risks (including, but not limited to, pain, bleeding, infection, scarring, need for blood transfusion, damage to surrounding structures- blood vessels/nerves/viscus/organs, damage to bile duct, bile leak, need for additional procedures, hernia, worsening of pre-existing medical conditions, pancreatitis, pneumonia, heart attack, stroke, death) benefits and alternatives to surgery were discussed at length. I noted a good probability that the procedure would help improve her symptoms. The patient's questions were answered to her satisfaction, she voiced understanding and elected to proceed with surgery. Additionally, we discussed typical postoperative expectations and the recovery process.  Marin Olp, MD [facility], A [facility]

## 2021-11-04 NOTE — Progress Notes (Signed)
Pt unable to pick up her discharged medication from [facility] because it was closed. I paged Dr. Cliffton Asters. He was going to attempt to send a prescription to another pharmacy. The pt wanted a dose of Oxycodone before discharge. AVS explained to the pt and her husband. Pt was escorted off the unit by wheelchair with her belongings.

## 2021-11-04 NOTE — ED Notes (Signed)
Pt given water at bedside for fluid challenge.

## 2021-11-04 NOTE — Progress Notes (Addendum)
Pt remains drowsy after return from PACU but is easily awakened to voice. VS WDL on RA. Medicated per [facility] for 8/10 abdominal pain. Pt denies any nausea and is DTV.

## 2021-11-04 NOTE — Anesthesia Preprocedure Evaluation (Addendum)
Anesthesia Evaluation  Patient identified by MRN, date of birth, ID band Patient awake    Reviewed: Allergy & Precautions, NPO status , Patient's Chart, lab work & pertinent test results  Airway Mallampati: I  TM Distance: >3 FB Neck ROM: Full    Dental  (+) Teeth Intact, Dental Advisory Given   Pulmonary    breath sounds clear to auscultation       Cardiovascular  Rhythm:Regular Rate:Normal     Neuro/Psych  Headaches, PSYCHIATRIC DISORDERS Anxiety    GI/Hepatic Neg liver ROS, GERD  Medicated,  Endo/Other  negative endocrine ROS  Renal/GU negative Renal ROS     Musculoskeletal   Abdominal Normal abdominal exam  (+)   Peds  Hematology negative hematology ROS (+)   Anesthesia Other Findings   Reproductive/Obstetrics                            Anesthesia Physical Anesthesia Plan  ASA: 2  Anesthesia Plan: General   Post-op Pain Management: Regional block*   Induction: Intravenous  PONV Risk Score and Plan: 4 or greater and Ondansetron, Dexamethasone, Midazolam and Scopolamine patch - Pre-op  Airway Management Planned: Oral ETT  Additional Equipment: None  Intra-op Plan:   Post-operative Plan: Extubation in OR  Informed Consent: I have reviewed the patients History and Physical, chart, labs and discussed the procedure including the risks, benefits and alternatives for the proposed anesthesia with the patient or authorized representative who has indicated his/her understanding and acceptance.     Dental advisory given  Plan Discussed with: CRNA  Anesthesia Plan Comments:        Anesthesia Quick Evaluation

## 2021-11-04 NOTE — Interval H&P Note (Signed)
History and Physical Interval Note:  11/04/2021 9:34 AM I have reviewed imaging, labs and seen/examined patient. Will proceed with lap chole today.  Jacqueline Turner  has presented today for surgery, with the diagnosis of acute cholecystitis.  The various methods of treatment have been discussed with the patient and family. After consideration of risks, benefits and other options for treatment, the patient has consented to  Procedure(s): LAPAROSCOPIC CHOLECYSTECTOMY WITH POSSIBLE INTRAOPERATIVE CHOLANGIOGRAM (N/A) INDOCYANINE GREEN FLUORESCENCE IMAGING (ICG) (N/A) as a surgical intervention.  The patient's history has been reviewed, patient examined, no change in status, stable for surgery.  I have reviewed the patient's chart and labs.  Questions were answered to the patient's satisfaction.     Jacqueline Turner

## 2021-11-05 LAB — URINE CULTURE

## 2021-11-05 NOTE — Discharge Summary (Signed)
Physician Discharge Summary  Patient ID: Jacqueline Turner MRN: [id number] DOB/AGE: 1981-01-29 41 y.o.  Admit date: 11/03/2021 Discharge date: 11/04/2021  Admission Diagnoses: Cholecystitis  Discharge Diagnoses:  Principal Problem:   Acute cholecystitis   Discharged Condition: good  Hospital Course: 73 yof underwent uneventful lap chole for cholecystitis. Doing well postop and discharged home  Consults: None  Significant Diagnostic Studies: none  Treatments: surgery: lap chole  Disposition: Discharge disposition: 01-Home or Self Care        Allergies as of 11/04/2021       Reactions   Sulfa Antibiotics Other (See Comments)   Unknown reaction         Medication List     TAKE these medications    Hailey 24 Fe 1-20 MG-MCG(24) tablet Generic drug: Norethindrone Acetate-Ethinyl Estrad-FE Take 1 tablet by mouth daily.   MULTIVITAMIN ADULT EXTRA C PO Take 1 tablet by mouth daily.   pantoprazole 40 MG tablet Commonly known as: Protonix Take 1 tablet (40 mg total) by mouth daily. What changed:  when to take this reasons to take this   Probiotic 1-250 BILLION-MG Caps Take 1 capsule by mouth daily.   Rybelsus 14 MG Tabs Generic drug: Semaglutide Take 1 tablet by mouth daily.   traMADol 50 MG tablet Commonly known as: ULTRAM Take 2 tablets (100 mg total) by mouth every 6 (six) hours as needed (postop pain not controled with tylenol/ibuprofen first).   Vyvanse 50 MG capsule Generic drug: lisdexamfetamine Take 50 mg by mouth daily.        Follow-up Information     [facility] Follow up in 2 week(s).   Specialty: General Surgery Contact information: [address] [place] [address] [phone]                Signed: Emelia Loron 11/05/2021, 12:01 PM

## 2021-11-06 ENCOUNTER — Encounter (HOSPITAL_COMMUNITY): Payer: Self-pay | Admitting: General Surgery

## 2021-11-07 LAB — SURGICAL PATHOLOGY

## 2021-12-18 ENCOUNTER — Other Ambulatory Visit: Payer: Self-pay | Admitting: Obstetrics and Gynecology

## 2021-12-18 DIAGNOSIS — R928 Other abnormal and inconclusive findings on diagnostic imaging of breast: Secondary | ICD-10-CM

## 2022-01-17 ENCOUNTER — Ambulatory Visit: Payer: BC Managed Care – PPO

## 2022-01-17 ENCOUNTER — Ambulatory Visit
Admission: RE | Admit: 2022-01-17 | Discharge: 2022-01-17 | Disposition: A | Discharge status: Home / Self Care | Payer: BC Managed Care – PPO | Source: Ambulatory Visit | Attending: Obstetrics and Gynecology | Admitting: Obstetrics and Gynecology

## 2022-01-17 DIAGNOSIS — R928 Other abnormal and inconclusive findings on diagnostic imaging of breast: Secondary | ICD-10-CM

## 2022-02-25 IMAGING — CR DG CHEST 2V
1 series · 2 of 2 positions shown · non-contrast
Comparison: None.

CLINICAL DATA: Chest pain, epigastric abdominal pain, diaphoresis

EXAM:
CHEST - 2 VIEW

[Series 1: dg chest 2 view · 0.14mm/px · 2 of 2 slices shown]
[im 1/2]
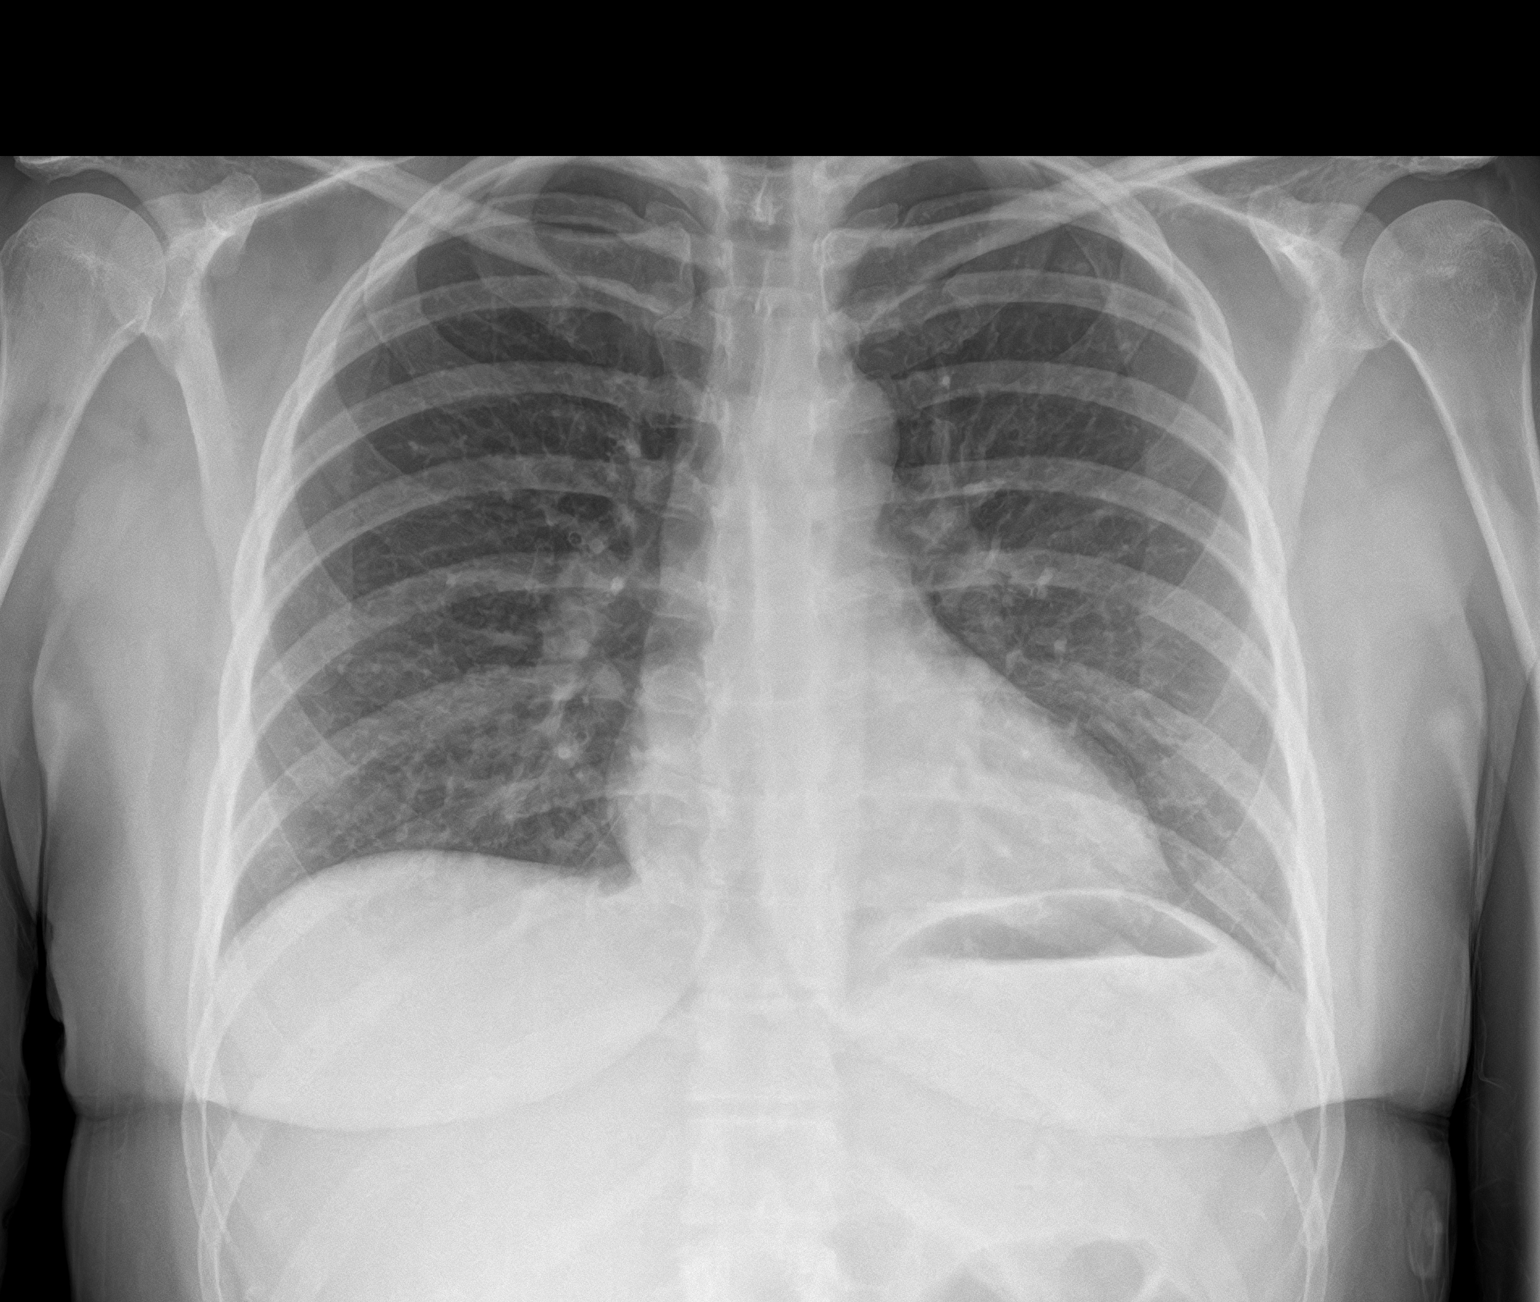
[im 2/2]
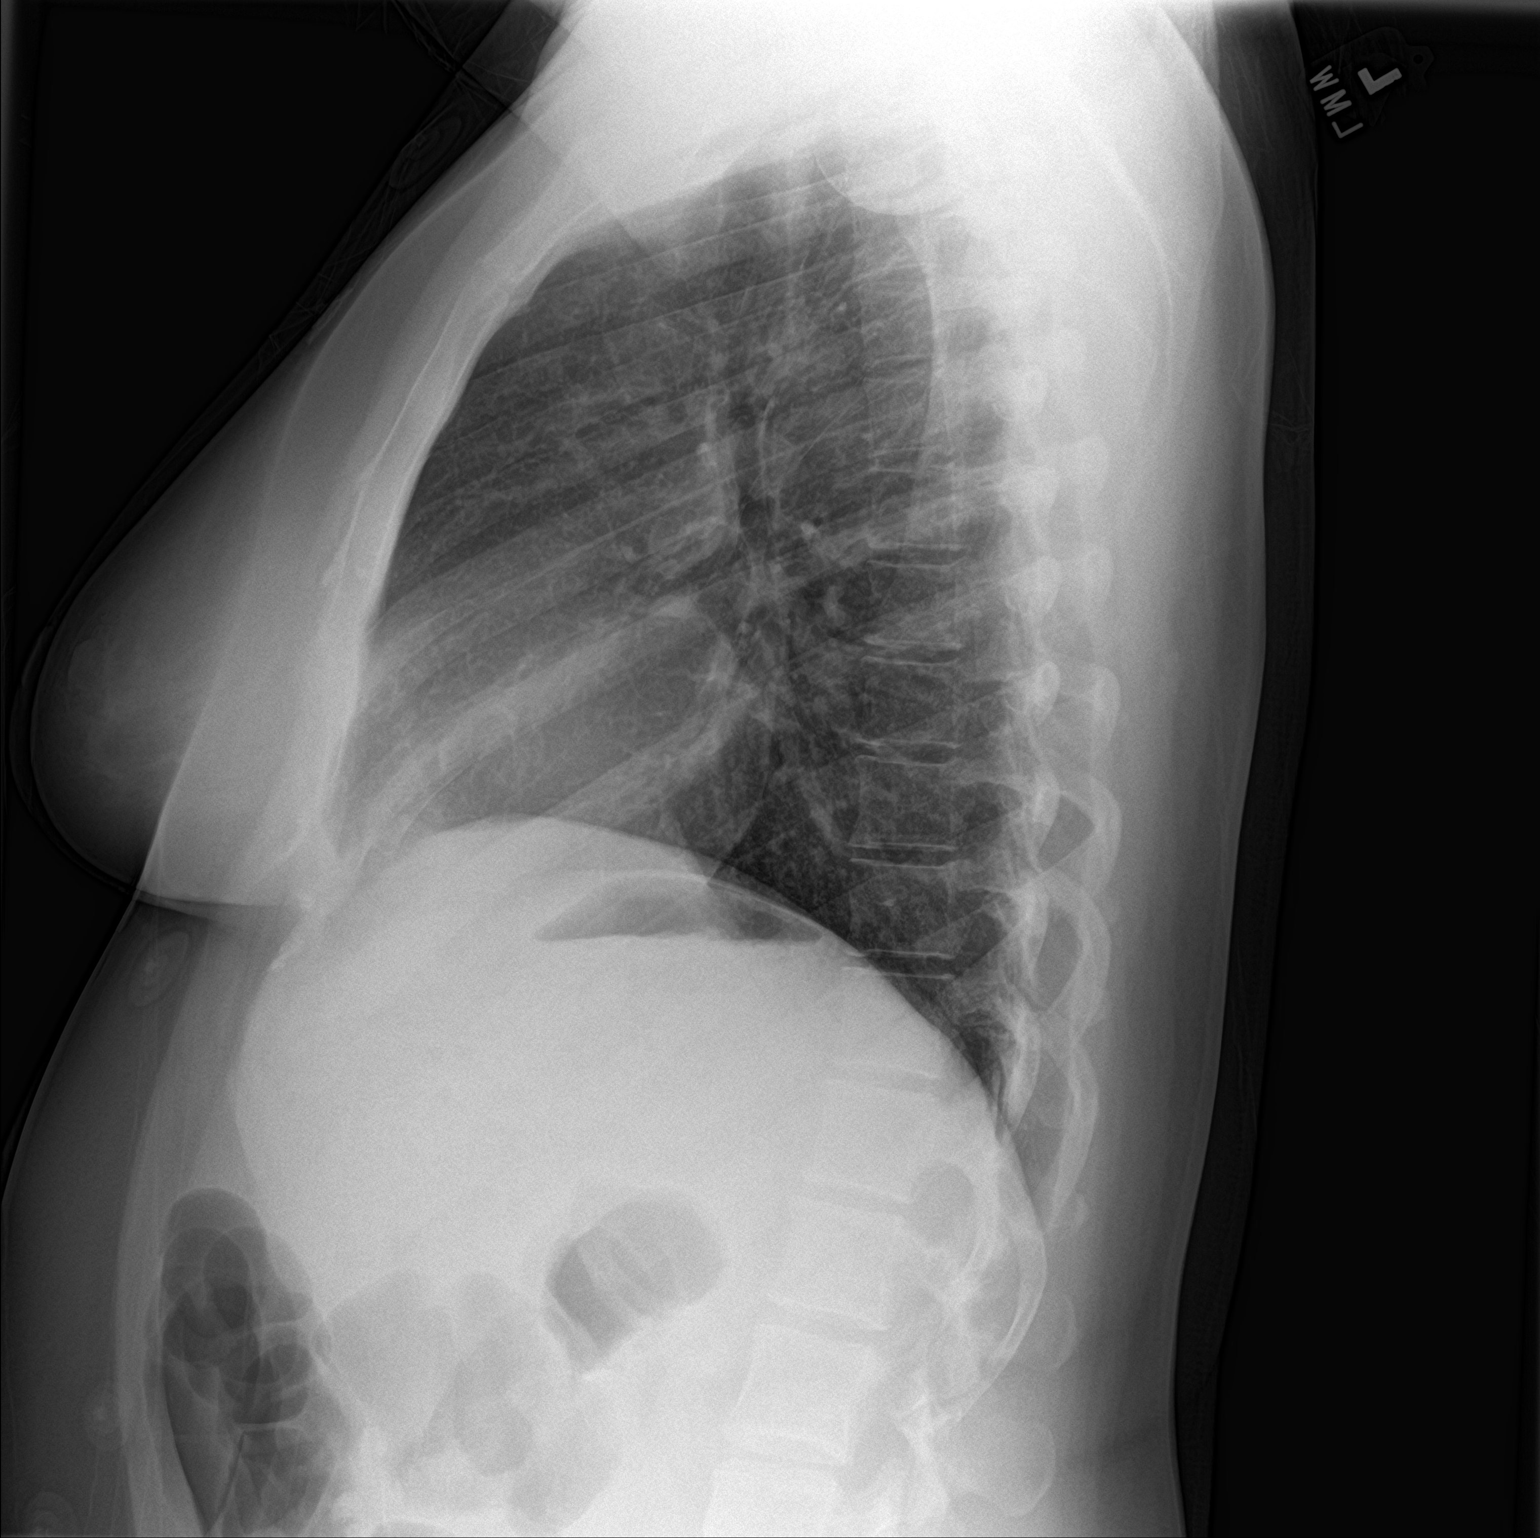

[2 of 2 positions shown; findings below may reference images not displayed]

FINDINGS: The heart size and mediastinal contours are within normal limits.
Both lungs are clear. The visualized skeletal structures are
unremarkable.
IMPRESSION: No active cardiopulmonary disease.

## 2022-03-01 ENCOUNTER — Other Ambulatory Visit: Payer: Self-pay | Admitting: Family Medicine

## 2022-03-01 DIAGNOSIS — K219 Gastro-esophageal reflux disease without esophagitis: Secondary | ICD-10-CM

## 2022-03-05 ENCOUNTER — Other Ambulatory Visit: Payer: Self-pay | Admitting: Obstetrics and Gynecology

## 2022-03-05 DIAGNOSIS — Z803 Family history of malignant neoplasm of breast: Secondary | ICD-10-CM

## 2022-03-29 ENCOUNTER — Ambulatory Visit
Admission: RE | Admit: 2022-03-29 | Discharge: 2022-03-29 | Disposition: A | Discharge status: Home / Self Care | Payer: BC Managed Care – PPO | Source: Ambulatory Visit | Attending: Obstetrics and Gynecology | Admitting: Obstetrics and Gynecology

## 2022-03-29 DIAGNOSIS — Z803 Family history of malignant neoplasm of breast: Secondary | ICD-10-CM

## 2022-03-29 MED ORDER — GADOPICLENOL 0.5 MMOL/ML IV SOLN
7.0000 mL | Freq: Once | INTRAVENOUS | Status: AC | PRN
  Administered 2022-03-29: 7 mL via INTRAVENOUS

## 2022-07-11 HISTORY — PX: TOTAL ABDOMINAL HYSTERECTOMY: SHX209

## 2022-07-11 HISTORY — PX: ABDOMINAL HYSTERECTOMY: SHX81

## 2022-08-20 ENCOUNTER — Encounter: Payer: Self-pay | Admitting: Family Medicine

## 2022-08-20 ENCOUNTER — Ambulatory Visit (INDEPENDENT_AMBULATORY_CARE_PROVIDER_SITE_OTHER): Payer: BC Managed Care – PPO | Admitting: Family Medicine

## 2022-08-20 VITALS — BP 116/68 | HR 65 | Temp 98.3°F | Ht 65.75 in | Wt 178.4 lb

## 2022-08-20 DIAGNOSIS — E663 Overweight: Secondary | ICD-10-CM

## 2022-08-20 DIAGNOSIS — Z0000 Encounter for general adult medical examination without abnormal findings: Secondary | ICD-10-CM | POA: Diagnosis not present

## 2022-08-20 DIAGNOSIS — Z9049 Acquired absence of other specified parts of digestive tract: Secondary | ICD-10-CM | POA: Diagnosis not present

## 2022-08-20 DIAGNOSIS — Z90710 Acquired absence of both cervix and uterus: Secondary | ICD-10-CM | POA: Diagnosis not present

## 2022-08-20 LAB — COMPREHENSIVE METABOLIC PANEL
ALT: 16 U/L (ref 0–35)
AST: 17 U/L (ref 0–37)
Albumin: 4.1 g/dL (ref 3.5–5.2)
Alkaline Phosphatase: 48 U/L (ref 39–117)
BUN: 12 mg/dL (ref 6–23)
CO2: 27 mEq/L (ref 19–32)
Calcium: 9.2 mg/dL (ref 8.4–10.5)
Chloride: 104 mEq/L (ref 96–112)
Creatinine, Ser: 0.56 mg/dL (ref 0.40–1.20)
GFR: 113.94 mL/min (ref >60.00)
Glucose, Bld: 81 mg/dL (ref 70–99)
Potassium: 4.5 mEq/L (ref 3.5–5.1)
Sodium: 139 mEq/L (ref 135–145)
Total Bilirubin: 1 mg/dL (ref 0.2–1.2)
Total Protein: 6.7 g/dL (ref 6.0–8.3)

## 2022-08-20 LAB — CBC
HCT: 38.5 % (ref 36.0–46.0)
Hemoglobin: 13 g/dL (ref 12.0–15.0)
MCHC: 33.7 g/dL (ref 30.0–36.0)
MCV: 93.3 fl (ref 78.0–100.0)
Platelets: 256 10*3/uL (ref 150.0–400.0)
RBC: 4.12 Mil/uL (ref 3.87–5.11)
RDW: 13.7 % (ref 11.5–15.5)
WBC: 5.1 10*3/uL (ref 4.0–10.5)

## 2022-08-20 NOTE — Progress Notes (Signed)
[facility] PRIMARY Trecia Rogers [facility] [address] [place] [address] Dept: [phone] Dept Fax: [phone]  Annual Physical Visit  Subjective:    Patient ID: Jacqueline Turner, female    DOB: 03-22-1981, 42 y.o..   MRN: [id number]  Chief Complaint  Patient presents with   Annual Exam    CPE/labs.   Fasting today.  No concerns.    History of Present Illness:  Patient is in today for an annual physical/preventative visit.  Ms. Eckel has a history of obesity, with a maximum weight of ~ 240 lb. She is followed through a weight loss program at [facility]. She continues to be on Vyvanse related to a binge eating issue.  In the last year, Ms. Bily has had 2 surgeries. She underwent laparoscopic cholecystectomy in Aug. About six weeks ago, she had a laparoscopic hysterectomy due top abnormal pap smears, recurrent vaginal bleeding, and fibroids. She feels she has recovered well from both of these surgeries. She is still on restricted activities since her  surgery.  Review of Systems  Constitutional:  Negative for chills, diaphoresis, fever, malaise/fatigue and weight loss.  HENT:  Positive for congestion. Negative for ear pain, hearing loss, sinus pain, sore throat and tinnitus.        Mild sinus symptoms. She had been off of her allergy meds around the time of her surgery.  Eyes:  Negative for blurred vision, pain, discharge and redness.  Respiratory:  Negative for cough, shortness of breath and wheezing.   Cardiovascular:  Negative for chest pain and palpitations.  Gastrointestinal:  Negative for abdominal pain, constipation, diarrhea, heartburn, nausea and vomiting.  Musculoskeletal:  Negative for back pain, joint pain and myalgias.  Skin:  Negative for itching and rash.  Psychiatric/Behavioral:  Negative for depression. The patient is not nervous/anxious.    Past Medical History: Patient Active Problem List   Diagnosis Date Noted   Binge  eating disorder 02/10/2021   Gastroesophageal reflux 11/29/2020   Migraine 09/02/2019   Seasonal allergies 09/02/2019   Overweight (BMI 25.0-29.9) 11/21/2006   Past Surgical History:  Procedure Laterality Date   ABDOMINAL HYSTERECTOMY  07/11/2022   CHOLECYSTECTOMY N/A 11/04/2021   Procedure: LAPAROSCOPIC CHOLECYSTECTOMY;  Surgeon: Emelia Loron, MD;  Location: [facility] OR;  Service: General;  Laterality: N/A;   WISDOM TOOTH EXTRACTION Bilateral 2001   Family History  Problem Relation Age of Onset   Cancer Mother        breast cancer   Diabetes Father    Heart disease Father    Hypertension Father    Hyperlipidemia Father    Heart disease Paternal Uncle    Heart disease Paternal Uncle    Heart disease Paternal Uncle    Cancer Paternal Uncle        Pancreatic   Cancer Maternal Grandmother        Skin, cervical   Stroke Maternal Grandmother    Dementia Maternal Grandmother    Cancer Paternal Grandfather        Leukemia   Outpatient Medications Prior to Visit  Medication Sig Dispense Refill   Bacillus Coagulans-Inulin (PROBIOTIC) 1-250 BILLION-MG CAPS Take 1 capsule by mouth daily.     Multiple Vitamins-Minerals (MULTIVITAMIN ADULT EXTRA C PO) Take 1 tablet by mouth daily.     VYVANSE 50 MG capsule Take 50 mg by mouth daily.     Norethindrone Acetate-Ethinyl Estrad-FE (HAILEY 24 FE) 1-20 MG-MCG(24) tablet Take 1 tablet by mouth daily.     pantoprazole (  PROTONIX) 40 MG tablet TAKE 1 TABLET BY MOUTH EVERY DAY 90 tablet 3   RYBELSUS 14 MG TABS Take 1 tablet by mouth daily.     traMADol (ULTRAM) 50 MG tablet Take 2 tablets (100 mg total) by mouth every 6 (six) hours as needed (postop pain not controled with tylenol/ibuprofen first). 10 tablet 0   No facility-administered medications prior to visit.   Allergies  Allergen Reactions   Sulfa Antibiotics Other (See Comments)    Unknown reaction    Objective:   Today's Vitals   08/20/22 0815  BP: 116/68  Pulse: 65  Temp: 98.3  F (36.8 C)  TempSrc: Temporal  SpO2: 97%  Weight: 178 lb 6.4 oz (80.9 kg)  Height: 5' 5.75" (1.67 m)   Body mass index is 29.01 kg/m.   General: Well developed, well nourished. No acute distress. HEENT: Normocephalic, non-traumatic. PERRL, EOMI. Conjunctiva clear. External ears normal. EAC and   TMs normal bilaterally. Nose clear without congestion or rhinorrhea. Mucous membranes moist.   Oropharynx clear. Good dentition. Neck: Supple. No lymphadenopathy. No thyromegaly. Lungs: Clear to auscultation bilaterally. No wheezing, rales or rhonchi. CV: RRR without murmurs or rubs. Pulses 2+ bilaterally. Abdomen: Soft, non-tender. Bowel sounds positive, normal pitch and frequency. No hepatosplenomegaly.   No rebound or guarding. Surgical sites are well healed. Extremities: Full ROM. No joint swelling or tenderness. Bialteral crepitance of both knees. No edema noted. Skin: Warm and dry. No rashes. Psych: Alert and oriented. Normal mood and affect.  Health Maintenance Due  Topic Date Due   HIV Screening  Never done     Assessment & Plan:   Problem List Items Addressed This Visit       Other   Overweight (BMI 25.0-29.9)    Weight is up mildly since last year, but she has had a recent surgery. She will resume regular physical activity when she is able.      Other Visit Diagnoses     Annual physical exam    -  Primary   Overall excellent health. Reviewed recommended screenings and immunizaitons. UTD.   S/P laparoscopic hysterectomy       Recommend we assess CBC to make sure she ahs no anemia after her surgery.   Relevant Orders   CBC   S/P laparoscopic cholecystectomy       Recommend we check liver enzymes to assure these corrected after her surgery.   Relevant Orders   Comprehensive metabolic panel       Return in about 1 year (around 08/20/2023) for Annual preventative care.   Loyola Mast, MD

## 2022-08-20 NOTE — Assessment & Plan Note (Signed)
Weight is up mildly since last year, but she has had a recent surgery. She will resume regular physical activity when she is able.

## 2022-12-28 ENCOUNTER — Other Ambulatory Visit (HOSPITAL_COMMUNITY): Payer: Self-pay

## 2022-12-31 ENCOUNTER — Other Ambulatory Visit (HOSPITAL_COMMUNITY): Payer: Self-pay

## 2022-12-31 MED ORDER — LISDEXAMFETAMINE DIMESYLATE 70 MG PO CAPS
70.0000 mg | ORAL_CAPSULE | Freq: Every day | ORAL | 0 refills | Status: DC
  Filled 2022-12-31: qty 30, 30d supply, fill #0

## 2023-01-30 ENCOUNTER — Other Ambulatory Visit (HOSPITAL_COMMUNITY): Payer: Self-pay

## 2023-01-30 MED ORDER — LISDEXAMFETAMINE DIMESYLATE 70 MG PO CAPS
70.0000 mg | ORAL_CAPSULE | Freq: Every day | ORAL | 0 refills | Status: DC
  Filled 2023-01-30: qty 30, 30d supply, fill #0

## 2023-01-31 ENCOUNTER — Other Ambulatory Visit (HOSPITAL_COMMUNITY): Payer: Self-pay

## 2023-03-04 ENCOUNTER — Other Ambulatory Visit (HOSPITAL_COMMUNITY): Payer: Self-pay

## 2023-03-04 MED ORDER — LISDEXAMFETAMINE DIMESYLATE 70 MG PO CAPS
70.0000 mg | ORAL_CAPSULE | Freq: Every day | ORAL | 0 refills | Status: DC
  Filled 2023-03-04: qty 30, 30d supply, fill #0

## 2023-04-09 ENCOUNTER — Other Ambulatory Visit (HOSPITAL_COMMUNITY): Payer: Self-pay

## 2023-04-09 MED ORDER — LISDEXAMFETAMINE DIMESYLATE 70 MG PO CAPS
70.0000 mg | ORAL_CAPSULE | Freq: Every day | ORAL | 0 refills | Status: DC
  Filled 2023-04-09: qty 30, 30d supply, fill #0

## 2023-04-09 MED ORDER — WEGOVY 0.25 MG/0.5ML ~~LOC~~ SOAJ
0.2500 mg | SUBCUTANEOUS | 0 refills | Status: DC
  Filled 2023-04-09: qty 2, 28d supply, fill #0

## 2023-04-23 ENCOUNTER — Other Ambulatory Visit (HOSPITAL_COMMUNITY): Payer: Self-pay

## 2023-04-26 ENCOUNTER — Other Ambulatory Visit (HOSPITAL_COMMUNITY): Payer: Self-pay

## 2023-05-10 ENCOUNTER — Other Ambulatory Visit (HOSPITAL_COMMUNITY): Payer: Self-pay

## 2023-05-10 MED ORDER — ZEPBOUND 2.5 MG/0.5ML ~~LOC~~ SOAJ
2.5000 mg | SUBCUTANEOUS | 0 refills | Status: DC
  Filled 2023-05-13 – 2023-05-14 (×2): qty 2, 28d supply, fill #0

## 2023-05-10 MED ORDER — LISDEXAMFETAMINE DIMESYLATE 70 MG PO CAPS
70.0000 mg | ORAL_CAPSULE | Freq: Every day | ORAL | 0 refills | Status: DC
  Filled 2023-05-10: qty 30, 30d supply, fill #0

## 2023-05-14 ENCOUNTER — Other Ambulatory Visit (HOSPITAL_COMMUNITY): Payer: Self-pay

## 2023-05-15 ENCOUNTER — Other Ambulatory Visit: Payer: Self-pay | Admitting: Medical Genetics

## 2023-05-24 ENCOUNTER — Other Ambulatory Visit (HOSPITAL_COMMUNITY): Payer: Self-pay

## 2023-05-28 ENCOUNTER — Encounter (INDEPENDENT_AMBULATORY_CARE_PROVIDER_SITE_OTHER): Payer: Self-pay

## 2023-05-29 ENCOUNTER — Other Ambulatory Visit (HOSPITAL_COMMUNITY): Payer: Self-pay

## 2023-06-05 ENCOUNTER — Other Ambulatory Visit: Payer: 59

## 2023-06-05 DIAGNOSIS — Z006 Encounter for examination for normal comparison and control in clinical research program: Secondary | ICD-10-CM

## 2023-06-07 ENCOUNTER — Other Ambulatory Visit (HOSPITAL_COMMUNITY): Payer: Self-pay

## 2023-06-07 MED ORDER — LISDEXAMFETAMINE DIMESYLATE 70 MG PO CAPS
70.0000 mg | ORAL_CAPSULE | Freq: Every day | ORAL | 0 refills | Status: DC
  Filled 2023-06-07: qty 30, 30d supply, fill #0

## 2023-06-14 ENCOUNTER — Other Ambulatory Visit (HOSPITAL_COMMUNITY): Payer: Self-pay

## 2023-06-14 MED ORDER — OSELTAMIVIR PHOSPHATE 75 MG PO CAPS
75.0000 mg | ORAL_CAPSULE | Freq: Two times a day (BID) | ORAL | 0 refills | Status: DC
  Filled 2023-06-14: qty 10, 5d supply, fill #0

## 2023-06-17 LAB — GENECONNECT MOLECULAR SCREEN: Genetic Analysis Overall Interpretation: NEGATIVE

## 2023-07-22 ENCOUNTER — Other Ambulatory Visit (HOSPITAL_COMMUNITY): Payer: Self-pay

## 2023-07-22 MED ORDER — LISDEXAMFETAMINE DIMESYLATE 70 MG PO CAPS
70.0000 mg | ORAL_CAPSULE | Freq: Every day | ORAL | 0 refills | Status: DC
  Filled 2023-07-22: qty 30, 30d supply, fill #0

## 2023-07-29 ENCOUNTER — Other Ambulatory Visit (HOSPITAL_COMMUNITY): Payer: Self-pay

## 2023-08-20 ENCOUNTER — Other Ambulatory Visit (HOSPITAL_COMMUNITY): Payer: Self-pay

## 2023-08-20 MED ORDER — LISDEXAMFETAMINE DIMESYLATE 70 MG PO CAPS
70.0000 mg | ORAL_CAPSULE | Freq: Every day | ORAL | 0 refills | Status: DC
  Filled 2023-08-28: qty 30, 30d supply, fill #0

## 2023-08-22 ENCOUNTER — Encounter: Payer: Self-pay | Admitting: Family Medicine

## 2023-08-22 ENCOUNTER — Ambulatory Visit (INDEPENDENT_AMBULATORY_CARE_PROVIDER_SITE_OTHER): Payer: BC Managed Care – PPO | Admitting: Family Medicine

## 2023-08-22 VITALS — BP 120/74 | HR 83 | Ht 65.75 in

## 2023-08-22 DIAGNOSIS — Z0000 Encounter for general adult medical examination without abnormal findings: Secondary | ICD-10-CM | POA: Diagnosis not present

## 2023-08-22 DIAGNOSIS — R000 Tachycardia, unspecified: Secondary | ICD-10-CM | POA: Diagnosis not present

## 2023-08-22 LAB — BASIC METABOLIC PANEL WITH GFR
BUN: 13 mg/dL (ref 6–23)
CO2: 27 meq/L (ref 19–32)
Calcium: 9.5 mg/dL (ref 8.4–10.5)
Chloride: 105 meq/L (ref 96–112)
Creatinine, Ser: 0.67 mg/dL (ref 0.40–1.20)
GFR: 108.36 mL/min (ref >60.00)
Glucose, Bld: 78 mg/dL (ref 70–99)
Potassium: 4.3 meq/L (ref 3.5–5.1)
Sodium: 139 meq/L (ref 135–145)

## 2023-08-22 LAB — CBC
HCT: 39.6 % (ref 36.0–46.0)
Hemoglobin: 13.6 g/dL (ref 12.0–15.0)
MCHC: 34.2 g/dL (ref 30.0–36.0)
MCV: 92.8 fl (ref 78.0–100.0)
Platelets: 267 10*3/uL (ref 150.0–400.0)
RBC: 4.27 Mil/uL (ref 3.87–5.11)
RDW: 12.9 % (ref 11.5–15.5)
WBC: 5 10*3/uL (ref 4.0–10.5)

## 2023-08-22 LAB — T4, FREE: Free T4: 1.02 ng/dL (ref 0.60–1.60)

## 2023-08-22 LAB — TSH: TSH: 1.98 u[IU]/mL (ref 0.35–5.50)

## 2023-08-22 NOTE — Assessment & Plan Note (Signed)
 Overall health is excellent. Recommend regular exercise. Discussed recommended screenings and immunizations.

## 2023-08-22 NOTE — Progress Notes (Signed)
 [facility]ARE [facility] [address] [place] [address] Dept: [phone] Dept Fax: [phone]  Annual Physical Visit  Subjective:    Patient ID: Jacqueline Turner, female    DOB: [birth year], 43 y.o..   MRN: [id number]  Chief Complaint  Patient presents with   Annual Exam    CPE/Labs.   Fasting today.   No concerns.    History of Present Illness:  Patient is in today for an annual physical/preventative visit.  Ms. Kovack has a history of obesity, with a maximum weight of ~ 240 lb. She is followed through a weight loss program at [facility]. She continues to be on Vyvanse  related to a binge eating issue. Her dosage is now at 70 mg daily, though she does not take this every day.  Review of Systems  Constitutional:  Negative for chills, diaphoresis, fever, malaise/fatigue and weight loss.  HENT:  Negative for congestion, ear pain, hearing loss, sinus pain, sore throat and tinnitus.   Eyes:  Positive for blurred vision. Negative for pain, discharge and redness.       Has gotten glasses, but doesn't wear them always. Eye doctor has been monitoring her for glaucoma, as she has an eye shape that puts her at risk for this developing.  Respiratory:  Negative for cough, shortness of breath and wheezing.   Cardiovascular:  Positive for palpitations. Negative for chest pain.       Notes her phone has been alerting her to episodes of tachycardia. She showed me a listing that shows regular episodes of heart rate between 120-140 bpm. She does sometimes feel when this is occurring. It can occur with rest. She only drinks one caffeinated drink a day.  Gastrointestinal:  Positive for heartburn. Negative for abdominal pain, constipation, diarrhea, nausea and vomiting.       Occasional heartburn that manages well with antacid.  Musculoskeletal:  Positive for joint pain. Negative for back pain and myalgias.       Generalized joint pains since her  hysterectomy. Recently, she started taking a turmeric supplement.  Skin:  Negative for itching and rash.  Neurological:  Positive for headaches.       Migraines are infrequent, though had a particularly severe one in the past week. She manages this with Excedrin, ibuprofen, and caffeine.  Psychiatric/Behavioral:  Negative for depression. The patient is not nervous/anxious.    Past Medical History: Patient Active Problem List   Diagnosis Date Noted   Binge eating disorder 02/10/2021   Gastroesophageal reflux 11/29/2020   Migraine 09/02/2019   Seasonal allergies 09/02/2019   Overweight (BMI 25.0-29.9) 11/21/2006   Past Surgical History:  Procedure Laterality Date   CHOLECYSTECTOMY N/A 11/04/2021   Procedure: LAPAROSCOPIC CHOLECYSTECTOMY;  Surgeon: Enid Harry, MD;  Location: [facility] OR;  Service: General;  Laterality: N/A;   TOTAL ABDOMINAL HYSTERECTOMY  07/11/2022   WISDOM TOOTH EXTRACTION Bilateral 2001   Family History  Problem Relation Age of Onset   Cancer Mother        breast cancer   Diabetes Father    Heart disease Father    Hypertension Father    Hyperlipidemia Father    Heart disease Paternal Uncle    Heart disease Paternal Uncle    Heart disease Paternal Uncle    Cancer Paternal Uncle        Pancreatic   Cancer Maternal Grandmother        Skin, cervical   Stroke Maternal Grandmother    Dementia  Maternal Grandmother    Cancer Paternal Grandfather        Leukemia   Outpatient Medications Prior to Visit  Medication Sig Dispense Refill   Bacillus Coagulans-Inulin (PROBIOTIC) 1-250 BILLION-MG CAPS Take 1 capsule by mouth daily.     lisdexamfetamine (VYVANSE ) 70 MG capsule Take 1 capsule (70 mg total) by mouth daily. 30 capsule 0   Multiple Vitamins-Minerals (MULTIVITAMIN ADULT EXTRA C PO) Take 1 tablet by mouth daily.     Turmeric Curcumin 500 MG CAPS Take by mouth.     oseltamivir  (TAMIFLU ) 75 MG capsule Take 1 capsule (75 mg total) by mouth 2 (two) times daily.  10 capsule 0   Semaglutide -Weight Management (WEGOVY ) 0.25 MG/0.5ML SOAJ Inject 0.25 mg into the skin once a week. 2 mL 0   tirzepatide  (ZEPBOUND ) 2.5 MG/0.5ML Pen Inject 2.5 mg into the skin once a week. 2 mL 0   VYVANSE  50 MG capsule Take 50 mg by mouth daily.     No facility-administered medications prior to visit.   Allergies  Allergen Reactions   Sulfa Antibiotics Other (See Comments)    Unknown reaction    Objective:   Today's Vitals   08/22/23 0819  BP: 120/74  Pulse: 83  TempSrc: Temporal  SpO2: 100%  Height: 5' 5.75" (1.67 m)   Body mass index is 29.01 kg/m.   General: Well developed, well nourished. No acute distress. HEENT: Normocephalic, non-traumatic. PERRL, EOMI. Conjunctiva clear. External ears normal. EAC and TMs normal   bilaterally. Nose clear without congestion or rhinorrhea. Mucous membranes moist. Oropharynx clear. Good dentition. Neck: Supple. No lymphadenopathy. No thyromegaly. Lungs: Clear to auscultation bilaterally. No wheezing, rales or rhonchi. CV: RRR without murmurs or rubs. Pulses 2+ bilaterally. Abdomen: Soft, non-tender. Bowel sounds positive, normal pitch and frequency. No hepatosplenomegaly. No rebound   or guarding. Extremities: Full ROM. No joint swelling or tenderness. No edema noted. Skin: Warm and dry. No rashes. Psych: Alert and oriented. Normal mood and affect.  Health Maintenance Due  Topic Date Due   HIV Screening  Never done   EKG: Normal sinus rhythm (rate = 82)    Assessment & Plan:   Problem List Items Addressed This Visit       Other   Annual physical exam - Primary   Overall health is excellent. Recommend regular exercise. Discussed recommended screenings and immunizations.       Tachycardia   No EKG evidence of underlying heart issue. I will check labs for common causes of tachycardia. I suspect this is a side effect of her lisdexamfetamine (Vyvanse ). As such it is benign, thoguh she may consider a dosage  reduction or stopping this.      Relevant Orders   EKG 12-Lead (Completed)   Basic metabolic panel with GFR   TSH   T4, free   CBC    Return in about 1 year (around 08/21/2024) for Reassessment.   Graig Lawyer, MD

## 2023-08-22 NOTE — Assessment & Plan Note (Signed)
 No EKG evidence of underlying heart issue. I will check labs for common causes of tachycardia. I suspect this is a side effect of her lisdexamfetamine (Vyvanse ). As such it is benign, thoguh she may consider a dosage reduction or stopping this.

## 2023-08-23 ENCOUNTER — Ambulatory Visit: Payer: Self-pay | Admitting: Family Medicine

## 2023-08-28 ENCOUNTER — Other Ambulatory Visit (HOSPITAL_COMMUNITY): Payer: Self-pay

## 2023-09-17 ENCOUNTER — Other Ambulatory Visit (HOSPITAL_COMMUNITY): Payer: Self-pay

## 2023-09-17 MED ORDER — LISDEXAMFETAMINE DIMESYLATE 70 MG PO CAPS
70.0000 mg | ORAL_CAPSULE | Freq: Every day | ORAL | 0 refills | Status: DC
  Filled 2023-10-02: qty 30, 30d supply, fill #0

## 2023-09-18 ENCOUNTER — Other Ambulatory Visit (HOSPITAL_COMMUNITY): Payer: Self-pay

## 2023-10-02 ENCOUNTER — Other Ambulatory Visit (HOSPITAL_COMMUNITY): Payer: Self-pay

## 2023-10-25 ENCOUNTER — Other Ambulatory Visit (HOSPITAL_COMMUNITY): Payer: Self-pay

## 2023-10-25 MED ORDER — LISDEXAMFETAMINE DIMESYLATE 70 MG PO CAPS
70.0000 mg | ORAL_CAPSULE | Freq: Every day | ORAL | 0 refills | Status: AC
  Filled 2023-11-12: qty 30, 30d supply, fill #0

## 2023-11-12 ENCOUNTER — Other Ambulatory Visit: Payer: Self-pay

## 2023-11-12 ENCOUNTER — Other Ambulatory Visit (HOSPITAL_COMMUNITY): Payer: Self-pay

## 2024-03-30 ENCOUNTER — Other Ambulatory Visit (HOSPITAL_COMMUNITY): Payer: Self-pay

## 2024-03-30 MED ORDER — WEGOVY 0.25 MG/0.5ML ~~LOC~~ SOAJ
0.2500 mg | SUBCUTANEOUS | 0 refills | Status: AC
  Filled 2024-03-30: qty 2, 28d supply, fill #0

## 2024-04-01 ENCOUNTER — Other Ambulatory Visit (HOSPITAL_COMMUNITY): Payer: Self-pay

## 2024-04-10 ENCOUNTER — Telehealth: Admitting: Physician Assistant

## 2024-04-10 ENCOUNTER — Other Ambulatory Visit (HOSPITAL_COMMUNITY): Payer: Self-pay

## 2024-04-10 DIAGNOSIS — J019 Acute sinusitis, unspecified: Secondary | ICD-10-CM

## 2024-04-10 DIAGNOSIS — B9689 Other specified bacterial agents as the cause of diseases classified elsewhere: Secondary | ICD-10-CM

## 2024-04-10 MED ORDER — AMOXICILLIN-POT CLAVULANATE 875-125 MG PO TABS
1.0000 | ORAL_TABLET | Freq: Two times a day (BID) | ORAL | 0 refills | Status: AC
  Filled 2024-04-10: qty 14, 7d supply, fill #0

## 2024-04-10 MED ORDER — FLUTICASONE PROPIONATE 50 MCG/ACT NA SUSP
2.0000 | Freq: Every day | NASAL | 0 refills | Status: AC
  Filled 2024-04-10: qty 16, 30d supply, fill #0

## 2024-04-10 NOTE — Progress Notes (Signed)
 E-Visit for Sinus Problems  We are sorry that you are not feeling well.  Here is how we plan to help!  Based on what you have shared with me it looks like you have sinusitis.  Sinusitis is inflammation and infection in the sinus cavities of the head.  Based on your presentation I believe you most likely have Acute Bacterial Sinusitis.  This is an infection caused by bacteria and is treated with antibiotics. I have prescribed Augmentin  875mg /125mg  one tablet twice daily with food, for 7 days. and I have also prescribed Flonase  Nasal Spray Use 2 sprays in each nostril daily for 10-14 days You may use an oral decongestant such as Mucinex  D or if you have glaucoma or high blood pressure use plain Mucinex . Saline nasal spray help and can safely be used as often as needed for congestion.  If you develop worsening sinus pain, fever or notice severe headache and vision changes, or if symptoms are not better after completion of antibiotic, please schedule an appointment with a health care provider.    Sinus infections are not as easily transmitted as other respiratory infection, however we still recommend that you avoid close contact with loved ones, especially the very young and elderly.  Remember to wash your hands thoroughly throughout the day as this is the number one way to prevent the spread of infection!  Home Care: Only take medications as instructed by your medical team. Complete the entire course of an antibiotic. Do not take these medications with alcohol. A steam or ultrasonic humidifier can help congestion.  You can place a towel over your head and breathe in the steam from hot water coming from a faucet. Avoid close contacts especially the very young and the elderly. Cover your mouth when you cough or sneeze. Always remember to wash your hands.  Get Help Right Away If: You develop worsening fever or sinus pain. You develop a severe head ache or visual changes. Your symptoms persist after  you have completed your treatment plan.  Make sure you Understand these instructions. Will watch your condition. Will get help right away if you are not doing well or get worse.  Your e-visit answers were reviewed by a board certified advanced clinical practitioner to complete your personal care plan.  Depending on the condition, your plan could have included both over the counter or prescription medications.  If there is a problem please reply  once you have received a response from your provider.  Your safety is important to us .  If you have drug allergies check your prescription carefully.    You can use MyChart to ask questions about today's visit, request a non-urgent call back, or ask for a work or school excuse for 24 hours related to this e-Visit. If it has been greater than 24 hours you will need to follow up with your provider, or enter a new e-Visit to address those concerns.  You will get an e-mail in the next two days asking about your experience.  I hope that your e-visit has been valuable and will speed your recovery. Thank you for using e-visits.  I have spent 5 minutes in review of e-visit questionnaire, review and updating patient chart, medical decision making and response to patient.   Jacqueline Turner CHRISTELLA Dickinson, PA-C

## 2024-08-26 ENCOUNTER — Encounter: Admitting: Family Medicine
